# Patient Record
Sex: Female | Born: 1965 | Race: White | Hispanic: No | Marital: Married | Smoking: Current every day smoker
Health system: Southern US, Community
[De-identification: ages and names within clinical notes are randomized; demographics above are authoritative.]

## PROBLEM LIST (undated history)

## (undated) DIAGNOSIS — R51 Headache: Secondary | ICD-10-CM

## (undated) DIAGNOSIS — F32A Depression, unspecified: Secondary | ICD-10-CM

## (undated) DIAGNOSIS — N390 Urinary tract infection, site not specified: Secondary | ICD-10-CM

## (undated) DIAGNOSIS — F329 Major depressive disorder, single episode, unspecified: Secondary | ICD-10-CM

## (undated) DIAGNOSIS — G7000 Myasthenia gravis without (acute) exacerbation: Secondary | ICD-10-CM

## (undated) DIAGNOSIS — F419 Anxiety disorder, unspecified: Secondary | ICD-10-CM

## (undated) HISTORY — DX: Headache: R51

## (undated) HISTORY — DX: Anxiety disorder, unspecified: F41.9

## (undated) HISTORY — DX: Depression, unspecified: F32.A

## (undated) HISTORY — PX: ABDOMINAL HYSTERECTOMY: SHX81

## (undated) HISTORY — DX: Major depressive disorder, single episode, unspecified: F32.9

## (undated) HISTORY — DX: Urinary tract infection, site not specified: N39.0

---

## 2012-09-20 ENCOUNTER — Ambulatory Visit (HOSPITAL_COMMUNITY): Payer: BC Managed Care – PPO | Admitting: Psychiatry

## 2012-10-12 ENCOUNTER — Ambulatory Visit (INDEPENDENT_AMBULATORY_CARE_PROVIDER_SITE_OTHER): Payer: BC Managed Care – PPO | Admitting: Psychiatry

## 2012-10-12 ENCOUNTER — Encounter (HOSPITAL_COMMUNITY): Payer: Self-pay | Admitting: Psychiatry

## 2012-10-12 VITALS — Wt 124.0 lb

## 2012-10-12 DIAGNOSIS — F329 Major depressive disorder, single episode, unspecified: Secondary | ICD-10-CM

## 2012-10-12 DIAGNOSIS — F5105 Insomnia due to other mental disorder: Secondary | ICD-10-CM | POA: Insufficient documentation

## 2012-10-12 DIAGNOSIS — F418 Other specified anxiety disorders: Secondary | ICD-10-CM | POA: Insufficient documentation

## 2012-10-12 DIAGNOSIS — F341 Dysthymic disorder: Secondary | ICD-10-CM

## 2012-10-12 DIAGNOSIS — F411 Generalized anxiety disorder: Secondary | ICD-10-CM

## 2012-10-12 MED ORDER — MIRTAZAPINE 7.5 MG PO TABS: 7.5000 mg | ORAL_TABLET | Freq: Every day | ORAL | Status: DC

## 2012-10-12 MED ORDER — VENLAFAXINE HCL ER 150 MG PO CP24: 150.0000 mg | ORAL_CAPSULE | Freq: Every day | ORAL | Status: DC

## 2012-10-12 NOTE — Progress Notes (Signed)
Psychiatric Assessment Adult [phone]  Patient Identification:  Brooke Kerr Date of Evaluation:  10/12/2012 Start Time: 9:10 AM End Time: 10:34 AM  Chief Complaint: "I'm depressed on Effexor and I'm about to give up". Chief Complaint  Patient presents with  . Depression  . Establish Care   History of Chief Complaint:   For several years pt had noted a short fuse and moods that would go up and down.  She started on Effexor with good benefit for several years.  Now she feels so mixed up that she is unsure if it is helping or not.  She has had migraines for the last 5 years.  Topamax has helped them along with the Sumatriptan.   She supplements with Magnesium with good results. She has struggled with taking itand taking it she can't take any more.  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  She couldn't handle it any more and quit 1 year ago.  HPI Review of Systems  Constitutional: Negative.        Weight shifted from 112 to 120 up until 4 months ago.  HENT:       Nocturnal bruxism.  Eyes: Negative.   Cardiovascular: Negative.   Gastrointestinal: Positive for constipation.       Magnesium has relived the constipation  Endocrine: Negative.   Genitourinary: Negative.   Musculoskeletal: Negative.   Neurological: Positive for headaches. Negative for dizziness, tremors, seizures, syncope, facial asymmetry, speech difficulty, weakness, light-headedness and numbness.       Migraines relieved by Topamax and Sumatriptan   Psychiatric/Behavioral: Positive for sleep disturbance, dysphoric mood, decreased concentration and agitation. Negative for suicidal ideas, hallucinations, behavioral problems, confusion and self-injury. The patient is nervous/anxious. The patient is not hyperactive.        Terminal insomnia after 1 hour of sleep or flipping and flopping all night   Physical Exam  Depressive Symptoms: depressed  mood, insomnia, feelings of worthlessness/guilt, difficulty concentrating, hopelessness, anxiety, disturbed sleep,  (Hypo) Manic Symptoms:   None  Anxiety Symptoms: Excessive Worry:  Yes Panic Symptoms:  Yes Agoraphobia:  Yes Obsessive Compulsive: No Specific Phobias:  No Social Anxiety:  No  Psychotic Symptoms:  None  PTSD Symptoms: None  Traumatic Brain Injury: No   Past Psychiatric History: Diagnosis: Depression  Hospitalizations: none  Outpatient Care: OBGYN  Substance Abuse Care: none  Self-Mutilation: none  Suicidal Attempts: none  Violent Behaviors: none   Past Medical History:   Past Medical History  Diagnosis Date  . Anxiety   . Depression   . Headache   . UTI (lower urinary tract infection)    History of Loss of Consciousness:  No Seizure History:  No Cardiac History:  No Allergies:   Allergies  Allergen Reactions  . Ambien (Zolpidem) Other (See Comments)    "Loopy"   Current Medications:  Current Outpatient Prescriptions  Medication Sig Dispense Refill  . SUMAtriptan (IMITREX) 100 MG tablet Take 100 mg by mouth every 2 (two) hours as needed for migraine.      . topiramate (TOPAMAX) 100 MG tablet Take 100 mg by mouth 2 (two) times daily.      Marland Kitchen venlafaxine XR (EFFEXOR-XR) 150 MG 24 hr capsule Take 150 mg by mouth daily.       No current facility-administered medications for this visit.    Previous Psychotropic Medications:  Medication Dose   Xanax     Klonopin    Ambien  Effexor    Topamax          Substance Abuse History in the last 12 months: Substance Age of 1st Use Last Use Amount Specific Type  Nicotine  16  this AM      Alcohol  17  5 mo ago      Cannabis  none        Opiates  none        Cocaine  none        Methamphetamines  none        LSD  none        Ecstasy  none         Benzodiazepines  none        Caffeine  childhood  This AM      Inhalants  none        Others:       sugar  childhood  This AM                   Medical Consequences of Substance Abuse: none  Legal Consequences of Substance Abuse: none  Family Consequences of Substance Abuse: none  Social History: Current Place of Residence: [address] [place] [address] Place of Birth: [place], [place] Family Members: Husband Marital Status:  Married Children: 2  Sons: 1  Daughters: 1 Relationships: husband Education:  Corporate treasurer Problems/Performance: fine Religious Beliefs/Practices: Baptist History of Abuse: none Armed forces technical officer; Archivist History:  None. Legal History: none Hobbies/Interests: yard work, grand daughter  Family History:  No family history on file.  Mental Status Examination/Evaluation: Objective:  Appearance: Casual  Eye Contact::  Good  Speech:  Clear and Coherent  Volume:  Normal  Mood:  anxious  Affect:  Congruent  Thought Process:  Coherent  Orientation:  Full (Time, Place, and Person)  Thought Content:  WDL  Suicidal Thoughts:  No  Homicidal Thoughts:  No  Judgement:  Fair  Insight:  Fair  Psychomotor Activity:  Normal  Akathisia:  No  Handed:  Right  AIMS (if indicated):    Assets:  Communication Skills Desire for Improvement    Laboratory/X-Ray Psychological Evaluation(s)   none  none   Assessment:    AXIS I Dysthymic Disorder and Generalized Anxiety Disorder  AXIS II Deferred  AXIS III Past Medical History  Diagnosis Date  . Anxiety   . Depression   . Headache   . UTI (lower urinary tract infection)      AXIS IV other psychosocial or environmental problems  AXIS V 51-60 moderate symptoms   Treatment Plan/Recommendations:  Psychotherapy: supportive and CBT  Medications: Continue Effexor and add 5HTP along with low dose Remeron  Routine PRN Medications:  No  Consultations: none  Safety Concerns:  none  Other:     Plan/Discussion: I took her vitals.  I reviewed CC, tobacco/med/surg Hx, meds effects/ side effects, problem list, therapies and  responses as well as current situation/symptoms discussed options. See orders and pt instructions for more details.  MEDICATIONS this encounter: Meds ordered this encounter  Medications  . venlafaxine XR (EFFEXOR-XR) 150 MG 24 hr capsule    Sig: Take 150 mg by mouth daily.  Marland Kitchen topiramate (TOPAMAX) 100 MG tablet    Sig: Take 100 mg by mouth 2 (two) times daily.  . SUMAtriptan (IMITREX) 100 MG tablet    Sig: Take 100 mg by mouth every 2 (two) hours as needed for migraine.    Medical Decision Making Problem Points:  New problem, with additional work-up planned (4) and Review of psycho-social stressors (1) Data Points:  Review or order clinical lab tests (1) Review of medication regiment & side effects (2) Review of new medications or change in dosage (2)  I certify that outpatient services furnished can reasonably be expected to improve the patient's condition.   Orson Aloe, MD, [facility]

## 2012-10-12 NOTE — Patient Instructions (Addendum)
One fourth teaspoon of Cream of Tartar in a glass of water every day may help prevent urinary tract infections.   Could use "Move Free" or "Osteo bi Flex" for arthritic pain.   The important ingredients are Chondrotin Sulfate and Glucosamine.  Tumeric is also helpful for arthritis.   Krill oil and cod liver oil may be helpful for arthritis.   MegaChaga contains Oregeno and Chaga and seems to be very helpful  [employer] is a great source for all of these.  [phone]  Glori Luis is a mushroom that has the strongest antiinflammatory properties of any substance known to mankind.  Among other sources, it can be ordered from [facility].com  "Native Wisdom for [employer] by Camelia Phenes is a book that could be very helpful.  Some have gotten it on [employer].com for very low cost.  Strongly consider attending at least 6 Alanon Meetings to help you learn about how your helping others to the exclusion of helping yourself is actually hurting yourself and is actually an addiction to fixing others and that you need to work the 12 Step to Happiness through the [employer]. Al-Anon Family Groups could be helpful with how to deal with substance abusing family and friends. Or your own issues of being in victim role.  There are only 40 Alanon Family Group meetings a week here in [place].  Online are current listing of those meetings @ [place]alan[place].org/html/meetings.html  There are [employer].  Search on line and there you can learn the format and can access the schedule for yourself.  Their number is [phone]  Adult Children of Alcoholics has some amazing literature available on line.  The work book is Tree surgeon.  Their web site is awesome.  Set a timer for 8 or a certain number minutes and walk for that amount of time in the house or in the yard.  Mark the number of minutes on a calendar for that day.  Do that every day this week.  Then next week increase the time by 1  minutes and then mark the calendar with the number of minutes for that day.  Each week increase your exercise by one minute.  Keep a record of this so you can see the progress you are making.  Do this every day, just like eating and sleeping.  It is good for pain control, depression, and for your soul/spirit.  Bring the record in for your next visit so we can talk about your effort and how you feel with the new exercise program going and working for you.  Tilden Fossa is an excellent resource for hormone replacement.  She is a Publishing rights manager who has practiced for 15 years in the field of weight management, but has ventured into hormone replacement and is quite knowledgeable about that.  Her phone number is [phone].    CUT BACK/CUT OUT on sugar and carbohydrates, that means very limited fruits and starchy vegetables and very limited grains, breads  The goal is low GLYCEMIC INDEX.  CUT OUT all wheat, rye, or barley for the GLUTEN in them.  HIGH fat and LOW carbohydrate diet is the KEY.  Eat avocados, eggs, lean meat like grass fed beef and chicken  Nuts and seeds would be good foods as well.   Stevia is an excellent sweetener.  Safe for the brain.   Lowella Grip is also a good safe sweetener, not the baking blend form of Truvia  Almond butter is awesome.  Check out  all this on the Internet.  Dr Heber Lawton is on the Internet with some good info about this.   http://www.drperlmutter.com is where that is.  An excellent site for info on this diet is http://paleoleap.com  [employer] makes dark chocolate that is sweetened with Stevia that is safe.  5 HTP 50 mg would something to add to the Effexor to get more antidepressant action out of it.  Take care of yourself.  No one else is standing up to do the job and only you know what you need.   GET SERIOUS about taking care of yourself.  Do the next right thing and that often means doing something to care for yourself along the lines  of are you hungry, are you angry, are you lonely, are you tired, are you scared?  HALTS is what that stands for.  Call if problems or concerns.

## 2012-11-09 ENCOUNTER — Ambulatory Visit (HOSPITAL_COMMUNITY): Payer: BC Managed Care – PPO | Admitting: Psychiatry

## 2012-11-09 ENCOUNTER — Encounter (HOSPITAL_COMMUNITY): Payer: Self-pay | Admitting: Psychiatry

## 2012-11-09 ENCOUNTER — Ambulatory Visit (INDEPENDENT_AMBULATORY_CARE_PROVIDER_SITE_OTHER): Payer: BC Managed Care – PPO | Admitting: Psychiatry

## 2012-11-09 VITALS — BP 114/85 | HR 81 | Ht 66.75 in | Wt 126.4 lb

## 2012-11-09 DIAGNOSIS — F329 Major depressive disorder, single episode, unspecified: Secondary | ICD-10-CM

## 2012-11-09 DIAGNOSIS — F418 Other specified anxiety disorders: Secondary | ICD-10-CM

## 2012-11-09 DIAGNOSIS — F411 Generalized anxiety disorder: Secondary | ICD-10-CM

## 2012-11-09 DIAGNOSIS — F341 Dysthymic disorder: Secondary | ICD-10-CM

## 2012-11-09 MED ORDER — TRAZODONE HCL 50 MG PO TABS: 25.0000 mg | ORAL_TABLET | Freq: Every day | ORAL | Status: DC

## 2012-11-09 MED ORDER — VENLAFAXINE HCL ER 75 MG PO CP24: 225.0000 mg | ORAL_CAPSULE | Freq: Every day | ORAL | Status: DC

## 2012-11-09 NOTE — Patient Instructions (Signed)
Keep it up to take care of yourself.  "I am Wishes Fulfilled Meditation" by Marylene Buerger and Lyndal Pulley may be helpful MUSIC for getting to sleep or for meditating You can order it from on line.  You might find the Chill channel on [employer] and explore the artists that you like better.   We are striving for CREATING  Take care of yourself.  No one else is standing up to do the job and only you know what you need.   GET SERIOUS about taking care of yourself.  Do the next right thing and that often means doing something to care for yourself along the lines of are you hungry, are you angry, are you lonely, are you tired, are you scared?  HALTS is what that stands for.  Call if problems or concerns.

## 2012-11-09 NOTE — Progress Notes (Signed)
[facility] [address] Progress Note Rheta Hemmelgarn. Gosling MRN: [id number] DOB: [birth year] Age: 47 y.o.  Date: 11/09/2012 Start Time: 9:55 AM End Time: 10:40 AM  Chief Complaint: Chief Complaint  Patient presents with  . Depression  . Follow-up  . Medication Refill   Subjective: "I have been walking some and I just can't let got of helping others". Depression 5/10 and Anxiety 6/10, where 0 is none and 10 is the worst. Pain is 0/10.  The patient returns for follow-up appointment.  Pt reports that she is compliant with the psychotropic medications with poor benefit and no noticeable side effects.  Pt noted no benefit to the Remeron for sleep.  She is struggled with taking care of herself and has kept it up for a whole month.  She started reading in the Native Wisdom book and related to the passage about Roots which was the devotion from a few days ago that she is only here in the mega care taker role from her family of origin.  She is trying to warn her daughter about this, but I cautioned her that her daughter will have to experience this for herself.  Her husband will have to have neck surgery in 2 weeks.  She was given the notion of "Can this person physically do this themselves, if so then she needs to let that activity go and let them do that".  With no benefit from the Remeon, will stop.  With her never having been on 225 mg of Effexor, will push to that and add Trazodone for sedation.  History of Chief Complaint:   For several years pt had noted a short fuse and moods that would go up and down.  She started on Effexor with good benefit for several years.  Now she feels so mixed up that she is unsure if it is helping or not.  She has had migraines for the last 5 years.  Topamax has helped them along with the Sumatriptan.   She supplements with Magnesium with good results. She has struggled with taking itand taking it she can't take any more.  Her job sifted and she worked for a company  that had several people quit and not hire replacements.  She couldn't handle it any more and quit 1 year ago.  HPI Review of Systems  Constitutional: Negative.        Weight shifted from 112 to 120 up until 4 months ago.  HENT:       Nocturnal bruxism.  Eyes: Negative.   Cardiovascular: Negative.   Gastrointestinal: Positive for constipation.       Magnesium has relived the constipation  Endocrine: Negative.   Genitourinary: Negative.   Musculoskeletal: Negative.   Neurological: Positive for headaches. Negative for dizziness, tremors, seizures, syncope, facial asymmetry, speech difficulty, weakness, light-headedness and numbness.       Migraines relieved by Topamax and Sumatriptan   Psychiatric/Behavioral: Positive for sleep disturbance, dysphoric mood, decreased concentration and agitation. Negative for suicidal ideas, hallucinations, behavioral problems, confusion and self-injury. The patient is nervous/anxious. The patient is not hyperactive.        Terminal insomnia after 1 hour of sleep or flipping and flopping all night   Physical Exam  Depressive Symptoms: depressed mood, insomnia, feelings of worthlessness/guilt, difficulty concentrating, hopelessness, anxiety, disturbed sleep,  (Hypo) Manic Symptoms:   None  Anxiety Symptoms: Excessive Worry:  Yes Panic Symptoms:  Yes Agoraphobia:  Yes Obsessive Compulsive: No Specific Phobias:  No Social Anxiety:  No  Psychotic Symptoms:  None  PTSD Symptoms: None  Traumatic Brain Injury: No   Past Psychiatric History: Diagnosis: Depression  Hospitalizations: none  Outpatient Care: OBGYN  Substance Abuse Care: none  Self-Mutilation: none  Suicidal Attempts: none  Violent Behaviors: none   Past Medical History:   Past Medical History  Diagnosis Date  . Anxiety   . Depression   . Headache   . UTI (lower urinary tract infection)    History of Loss of Consciousness:  No Seizure History:  No Cardiac History:   No Allergies:   Allergies  Allergen Reactions  . Ambien (Zolpidem) Other (See Comments)    "Loopy"   Current Medications:  Current Outpatient Prescriptions  Medication Sig Dispense Refill  . venlafaxine XR (EFFEXOR-XR) 75 MG 24 hr capsule Take 3 capsules (225 mg total) by mouth daily.  90 capsule  1  . SUMAtriptan (IMITREX) 100 MG tablet Take 100 mg by mouth every 2 (two) hours as needed for migraine.      . topiramate (TOPAMAX) 100 MG tablet Take 100 mg by mouth 2 (two) times daily.      . traZODone (DESYREL) 50 MG tablet Take 0.5-2 tablets (25-100 mg total) by mouth at bedtime.  60 tablet  1   No current facility-administered medications for this visit.    Previous Psychotropic Medications:  Medication Dose   Xanax     Klonopin    Ambien    Effexor    Topamax    Substance Abuse History in the last 12 months: Substance Age of 1st Use Last Use Amount Specific Type  Nicotine  16  this AM      Alcohol  17  5 mo ago      Cannabis  none        Opiates  none        Cocaine  none        Methamphetamines  none        LSD  none        Ecstasy  none         Benzodiazepines  none        Caffeine  childhood  This AM      Inhalants  none        Others:       sugar  childhood  This AM    Medical Consequences of Substance Abuse: none Legal Consequences of Substance Abuse: none Family Consequences of Substance Abuse: none  Social History: Current Place of Residence: [address] [place] [address] Place of Birth: [place], [place] Family Members: Husband Marital Status:  Married Children: 2  Sons: 1  Daughters: 1 Relationships: husband Education:  Corporate treasurer Problems/Performance: fine Religious Beliefs/Practices: Baptist History of Abuse: none Armed forces technical officer; Archivist History:  None. Legal History: none Hobbies/Interests: yard work, grand daughter  Family History:   Family History  Problem Relation Age of Onset  . Dementia Maternal  Grandfather   . Depression Maternal Grandmother   . ADD / ADHD Neg Hx   . Alcohol abuse Neg Hx   . Drug abuse Neg Hx   . Bipolar disorder Neg Hx   . Anxiety disorder Neg Hx   . OCD Neg Hx   . Paranoid behavior Neg Hx   . Schizophrenia Neg Hx   . Sexual abuse Neg Hx   . Physical abuse Neg Hx   . Seizures Son     Mental Status Examination/Evaluation: Objective:  Appearance: Casual  Eye Contact::  Good  Speech:  Clear and Coherent  Volume:  Normal  Mood:  anxious  Affect:  Congruent  Thought Process:  Coherent  Orientation:  Full (Time, Place, and Person)  Thought Content:  WDL  Suicidal Thoughts:  No  Homicidal Thoughts:  No  Judgement:  Fair  Insight:  Fair  Psychomotor Activity:  Normal  Akathisia:  No  Handed:  Right  AIMS (if indicated):    Assets:  Communication Skills Desire for Improvement   Assessment:    AXIS I Dysthymic Disorder and Generalized Anxiety Disorder  AXIS II Deferred  AXIS III Past Medical History  Diagnosis Date  . Anxiety   . Depression   . Headache   . UTI (lower urinary tract infection)      AXIS IV other psychosocial or environmental problems  AXIS V 51-60 moderate symptoms   Treatment Plan/Recommendations:  Psychotherapy: supportive and CBT  Medications: Continue Effexor and add 5HTP along with low dose Remeron  Routine PRN Medications:  No  Consultations: none  Safety Concerns:  none  Other:     Plan/Discussion: I took her vitals.  I reviewed CC, tobacco/med/surg Hx, meds effects/ side effects, problem list, therapies and responses as well as current situation/symptoms discussed options. Stop Remeron, ineffective, switch to Trazodone and push Effexor for better antidepressant action. See orders and pt instructions for more details.  MEDICATIONS this encounter: Meds ordered this encounter  Medications  . venlafaxine XR (EFFEXOR-XR) 75 MG 24 hr capsule    Sig: Take 3 capsules (225 mg total) by mouth daily.    Dispense:  90  capsule    Refill:  1  . traZODone (DESYREL) 50 MG tablet    Sig: Take 0.5-2 tablets (25-100 mg total) by mouth at bedtime.    Dispense:  60 tablet    Refill:  1   Medical Decision Making Problem Points:  Established problem, stable/improving (1), Established problem, worsening (2), Review of last therapy session (1) and Review of psycho-social stressors (1) Data Points:  Review or order clinical lab tests (1) Review of new medications or change in dosage (2)  I certify that outpatient services furnished can reasonably be expected to improve the patient's condition.   Orson Aloe, MD, [facility]

## 2012-11-21 ENCOUNTER — Telehealth (HOSPITAL_COMMUNITY): Payer: Self-pay | Admitting: Psychiatry

## 2012-11-21 DIAGNOSIS — F329 Major depressive disorder, single episode, unspecified: Secondary | ICD-10-CM

## 2012-11-21 DIAGNOSIS — F418 Other specified anxiety disorders: Secondary | ICD-10-CM

## 2012-11-22 MED ORDER — TRAZODONE HCL 100 MG PO TABS: 200.0000 mg | ORAL_TABLET | Freq: Every day | ORAL | Status: DC

## 2012-11-22 NOTE — Telephone Encounter (Signed)
Pt taking 2 of the 50's and needing more to get to sleep.She thinks that 4 would be better, She agrees to shift to the 100 mg tabs and will go with 2 of those.  Sent via eScript

## 2012-12-07 ENCOUNTER — Ambulatory Visit (HOSPITAL_COMMUNITY): Payer: BC Managed Care – PPO | Admitting: Psychiatry

## 2012-12-13 ENCOUNTER — Ambulatory Visit (INDEPENDENT_AMBULATORY_CARE_PROVIDER_SITE_OTHER): Payer: BC Managed Care – PPO | Admitting: Psychiatry

## 2012-12-13 ENCOUNTER — Encounter (HOSPITAL_COMMUNITY): Payer: Self-pay | Admitting: Psychiatry

## 2012-12-13 VITALS — BP 104/72 | HR 85 | Ht 68.5 in | Wt 125.8 lb

## 2012-12-13 DIAGNOSIS — F418 Other specified anxiety disorders: Secondary | ICD-10-CM

## 2012-12-13 DIAGNOSIS — F411 Generalized anxiety disorder: Secondary | ICD-10-CM

## 2012-12-13 DIAGNOSIS — F4010 Social phobia, unspecified: Secondary | ICD-10-CM

## 2012-12-13 DIAGNOSIS — F329 Major depressive disorder, single episode, unspecified: Secondary | ICD-10-CM

## 2012-12-13 DIAGNOSIS — F341 Dysthymic disorder: Secondary | ICD-10-CM

## 2012-12-13 MED ORDER — PROPRANOLOL HCL 10 MG PO TABS: 10.0000 mg | ORAL_TABLET | Freq: Three times a day (TID) | ORAL | Status: DC

## 2012-12-13 MED ORDER — VENLAFAXINE HCL ER 75 MG PO CP24: 225.0000 mg | ORAL_CAPSULE | Freq: Every day | ORAL | Status: DC

## 2012-12-13 MED ORDER — TRAZODONE HCL 100 MG PO TABS: 200.0000 mg | ORAL_TABLET | Freq: Every day | ORAL | Status: DC

## 2012-12-13 MED ORDER — PROPRANOLOL HCL ER 60 MG PO CP24: 60.0000 mg | ORAL_CAPSULE | Freq: Every day | ORAL | Status: DC

## 2012-12-13 NOTE — Progress Notes (Signed)
[facility] [address] Progress Note Brooke Kerr. Cipollone MRN: [id number] DOB: [birth year] Age: 47 y.o.  Date: 12/13/2012 Start Time: 2:00 PM End Time: 2:30 PM  Chief Complaint: Chief Complaint  Patient presents with  . Depression  . Follow-up  . Medication Refill   Subjective: "I have been working around my pool and singing the Frozen theme song "Let it go" to herself and her daughter has been reminding her to do so at opportune times". Depression 3 to 4/10 and Anxiety 6/10, where 0 is none and 10 is the worst. Pain is 0/10.  The patient returns for follow-up appointment.  Pt reports that she is compliant with the psychotropic medications with good benefit and no noticeable side effects.  Pt noted better sleep with the Trazodone.  She is still noting social anxiety,  Described the fight or flight response and she describes feeling that exact response and she feels it rise in her body.  She is game to try Inderal at reg dosing and the LA dosing.   Husband is doing well with his surgery.  History of Chief Complaint:   For several years pt had noted a short fuse and moods that would go up and down.  She started on Effexor with good benefit for several years.  Now she feels so mixed up that she is unsure if it is helping or not.  She has had migraines for the last 5 years.  Topamax has helped them along with the Sumatriptan.   She supplements with Magnesium with good results. She has struggled with taking itand taking it she can't take any more.  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  She couldn't handle it any more and quit 1 year ago.  HPI Review of Systems  Constitutional: Negative.        Weight shifted from 112 to 120 up until 4 months ago.  HENT:       Nocturnal bruxism.  Eyes: Negative.   Cardiovascular: Negative.   Gastrointestinal: Positive for constipation.       Magnesium has relived the constipation  Endocrine:  Negative.   Genitourinary: Negative.   Musculoskeletal: Negative.   Neurological: Positive for headaches. Negative for dizziness, tremors, seizures, syncope, facial asymmetry, speech difficulty, weakness, light-headedness and numbness.       Migraines relieved by Topamax and Sumatriptan   Psychiatric/Behavioral: Positive for sleep disturbance, dysphoric mood, decreased concentration and agitation. Negative for suicidal ideas, hallucinations, behavioral problems, confusion and self-injury. The patient is nervous/anxious. The patient is not hyperactive.        Terminal insomnia after 1 hour of sleep or flipping and flopping all night   Physical Exam Vitals: BP 104/72  Pulse 85  Ht 5' 8.5" (1.74 m)  Wt 125 lb 12.8 oz (57.063 kg)  BMI 18.85 kg/m2  Depressive Symptoms: depressed mood, insomnia, feelings of worthlessness/guilt, difficulty concentrating, hopelessness, anxiety, disturbed sleep,  (Hypo) Manic Symptoms:   None  Anxiety Symptoms: Excessive Worry:  Yes Panic Symptoms:  Yes Agoraphobia:  Yes Obsessive Compulsive: No Specific Phobias:  No Social Anxiety:  No  Psychotic Symptoms:  None  PTSD Symptoms: None  Traumatic Brain Injury: No  History of Loss of Consciousness:  No Seizure History:  No Cardiac History:  No  Past Psychiatric History: Diagnosis: Depression  Hospitalizations: none  Outpatient Care: OBGYN  Substance Abuse Care: none  Self-Mutilation: none  Suicidal Attempts: none  Violent Behaviors: none  Allergies: Allergies  Allergen Reactions  . Ambien (Zolpidem) Other (See Comments)    "Loopy"   Medical History: Past Medical History  Diagnosis Date  . Anxiety   . Depression   . Headache(784.0)   . UTI (lower urinary tract infection)    Surgical History: Past Surgical History  Procedure Laterality Date  . Abdominal hysterectomy     Family History: family history includes Dementia in her maternal grandfather; Depression in her  maternal grandmother; and Seizures in her son.  There is no history of ADD / ADHD, and Alcohol abuse, and Drug abuse, and Bipolar disorder, and Anxiety disorder, and OCD, and Paranoid behavior, and Schizophrenia, and Sexual abuse, and Physical abuse, . Reviewed and nothing is new today.  Current Medications:  Current Outpatient Prescriptions  Medication Sig Dispense Refill  . traZODone (DESYREL) 100 MG tablet Take 2 tablets (200 mg total) by mouth at bedtime.  60 tablet  2  . venlafaxine XR (EFFEXOR-XR) 75 MG 24 hr capsule Take 3 capsules (225 mg total) by mouth daily.  90 capsule  2  . propranolol (INDERAL) 10 MG tablet Take 1 tablet (10 mg total) by mouth 3 (three) times daily.  90 tablet  2  . propranolol ER (INDERAL LA) 60 MG 24 hr capsule Take 1 capsule (60 mg total) by mouth daily.  30 capsule  2  . SUMAtriptan (IMITREX) 100 MG tablet Take 100 mg by mouth every 2 (two) hours as needed for migraine.      . topiramate (TOPAMAX) 100 MG tablet Take 100 mg by mouth 2 (two) times daily.       No current facility-administered medications for this visit.    Previous Psychotropic Medications: Medication Dose   Xanax     Klonopin    Ambien    Effexor    Topamax    Substance Abuse History in the last 12 months: Substance Age of 1st Use Last Use Amount Specific Type  Nicotine  16  this AM      Alcohol  17  5 mo ago      Cannabis  none        Opiates  none        Cocaine  none        Methamphetamines  none        LSD  none        Ecstasy  none         Benzodiazepines  none        Caffeine  childhood  This AM      Inhalants  none        Others:       sugar  childhood  This AM    Medical Consequences of Substance Abuse: none Legal Consequences of Substance Abuse: none Family Consequences of Substance Abuse: none  Social History: Current Place of Residence: [address] [place] [address] Place of Birth: [place], [place] Family Members: Husband Marital Status:  Married Children:  2  Sons: 1  Daughters: 1 Relationships: husband Education:  Corporate treasurer Problems/Performance: fine Religious Beliefs/Practices: Baptist History of Abuse: none Armed forces technical officer; Archivist History:  None. Legal History: none Hobbies/Interests: yard work, grand daughter  Mental Status Examination/Evaluation: Objective:  Appearance: Casual  Eye Contact::  Good  Speech:  Clear and Coherent  Volume:  Normal  Mood:  anxious  Affect:  Congruent  Thought Process:  Coherent  Orientation:  Full (Time, Place, and Person)  Thought Content:  WDL  Suicidal Thoughts:  No  Homicidal Thoughts:  No  Judgement:  Fair  Insight:  Fair  Psychomotor Activity:  Normal  Akathisia:  No  Handed:  Right  AIMS (if indicated):    Assets:  Communication Skills Desire for Improvement   Assessment:   AXIS I Dysthymic Disorder and Generalized Anxiety Disorder  AXIS II Deferred  AXIS III Past Medical History  Diagnosis Date  . Anxiety   . Depression   . Headache(784.0)   . UTI (lower urinary tract infection)      AXIS IV other psychosocial or environmental problems  AXIS V 51-60 moderate symptoms   Treatment Plan/Recommendations: Psychotherapy: supportive and CBT  Medications: Continue Effexor and add 5HTP along with low dose Remeron  Routine PRN Medications:  No  Consultations: none  Safety Concerns:  none  Other:     Plan/Discussion: I took her vitals.  I reviewed CC, tobacco/med/surg Hx, meds effects/ side effects, problem list, therapies and responses as well as current situation/symptoms discussed options. Continue current effective medications and start Inderal for social anxiety. See orders and pt instructions for more details.  MEDICATIONS this encounter: Meds ordered this encounter  Medications  . venlafaxine XR (EFFEXOR-XR) 75 MG 24 hr capsule    Sig: Take 3 capsules (225 mg total) by mouth daily.    Dispense:  90 capsule    Refill:  2  . traZODone  (DESYREL) 100 MG tablet    Sig: Take 2 tablets (200 mg total) by mouth at bedtime.    Dispense:  60 tablet    Refill:  2  . propranolol (INDERAL) 10 MG tablet    Sig: Take 1 tablet (10 mg total) by mouth 3 (three) times daily.    Dispense:  90 tablet    Refill:  2  . propranolol ER (INDERAL LA) 60 MG 24 hr capsule    Sig: Take 1 capsule (60 mg total) by mouth daily.    Dispense:  30 capsule    Refill:  2    Hold until pt calls for this, may take with the regular sparingly.   Medical Decision Making Problem Points:  Established problem, stable/improving (1), New problem, with no additional work-up planned (3), Review of last therapy session (1) and Review of psycho-social stressors (1) Data Points:  Review or order clinical lab tests (1) Review of medication regiment & side effects (2) Review of new medications or change in dosage (2)  I certify that outpatient services furnished can reasonably be expected to improve the patient's condition.   Orson Aloe, MD, [facility]

## 2012-12-13 NOTE — Patient Instructions (Signed)
Try the regular Inderal first, if dizziness upon standing may take 1/2 tab 2 or 3 times a day.  If tolerated well could try the LA form on hold at the pharmacy.   Call if problems or concerns.

## 2013-01-08 ENCOUNTER — Other Ambulatory Visit (HOSPITAL_COMMUNITY): Payer: Self-pay | Admitting: Psychiatry

## 2013-02-13 ENCOUNTER — Ambulatory Visit (HOSPITAL_COMMUNITY): Payer: BC Managed Care – PPO | Admitting: Psychiatry

## 2013-03-13 ENCOUNTER — Other Ambulatory Visit (HOSPITAL_COMMUNITY): Payer: Self-pay | Admitting: Psychiatry

## 2013-05-03 ENCOUNTER — Ambulatory Visit (HOSPITAL_COMMUNITY): Payer: BC Managed Care – PPO | Admitting: Psychiatry

## 2013-06-16 ENCOUNTER — Other Ambulatory Visit (HOSPITAL_COMMUNITY): Payer: Self-pay | Admitting: Psychiatry

## 2013-06-19 NOTE — Telephone Encounter (Signed)
RX refill refused Pt last appt with Dr. Dan Humphreys in [place] office 12/13/12. No further appts

## 2015-08-30 ENCOUNTER — Ambulatory Visit (INDEPENDENT_AMBULATORY_CARE_PROVIDER_SITE_OTHER): Payer: BLUE CROSS/BLUE SHIELD | Admitting: "Endocrinology

## 2015-08-30 ENCOUNTER — Encounter: Payer: Self-pay | Admitting: "Endocrinology

## 2015-08-30 VITALS — BP 127/81 | HR 67 | Ht 68.5 in | Wt 111.0 lb

## 2015-08-30 DIAGNOSIS — E0590 Thyrotoxicosis, unspecified without thyrotoxic crisis or storm: Secondary | ICD-10-CM | POA: Diagnosis not present

## 2015-08-30 DIAGNOSIS — E890 Postprocedural hypothyroidism: Secondary | ICD-10-CM | POA: Insufficient documentation

## 2015-08-30 NOTE — Patient Instructions (Signed)
Please stop methimazole today, do your scan in 7 days and return in 10 days. Continue propranolol 40 mg by mouth two times a day.

## 2015-08-30 NOTE — Progress Notes (Signed)
Subjective:    Patient ID: Brooke Kerr, female    DOB: Apr 01, 1966, PCP Kennieth Rad, MD.   Past Medical History  Diagnosis Date  . Anxiety   . Depression   . Headache(784.0)   . UTI (lower urinary tract infection)    Past Surgical History  Procedure Laterality Date  . Abdominal hysterectomy     Social History   Social History  . Marital Status: Unknown    Spouse Name: N/A  . Number of Children: N/A  . Years of Education: N/A   Social History Main Topics  . Smoking status: Current Every Day Smoker  . Smokeless tobacco: Never Used     Comment: 3-6 cigs a day as of 12/13/2012  . Alcohol Use: No  . Drug Use: No  . Sexual Activity:    Partners: Male   Other Topics Concern  . None   Social History Narrative   Outpatient Encounter Prescriptions as of 08/30/2015  Medication Sig  . butalbital-acetaminophen-caffeine (FIORICET WITH CODEINE) 50-325-40-30 MG capsule Take 1 capsule by mouth every 4 (four) hours as needed for headache.  . Linaclotide (LINZESS) 145 MCG CAPS capsule Take 145 mcg by mouth daily.  . propranolol (INDERAL) 40 MG tablet Take 40 mg by mouth 2 (two) times daily.  Marland Kitchen topiramate (TOPAMAX) 200 MG tablet Take 200 mg by mouth 2 (two) times daily.  Marland Kitchen venlafaxine XR (EFFEXOR-XR) 75 MG 24 hr capsule Take 3 capsules (225 mg total) by mouth daily.  . [DISCONTINUED] methimazole (TAPAZOLE) 10 MG tablet Take 10 mg by mouth daily.  . [DISCONTINUED] propranolol (INDERAL) 10 MG tablet Take 1 tablet (10 mg total) by mouth 3 (three) times daily.  . [DISCONTINUED] propranolol ER (INDERAL LA) 60 MG 24 hr capsule Take 1 capsule (60 mg total) by mouth daily.  . [DISCONTINUED] SUMAtriptan (IMITREX) 100 MG tablet Take 100 mg by mouth every 2 (two) hours as needed for migraine.  . [DISCONTINUED] topiramate (TOPAMAX) 100 MG tablet Take 100 mg by mouth 2 (two) times daily.  . [DISCONTINUED] traZODone (DESYREL) 100 MG tablet Take 2 tablets (200 mg total) by mouth at bedtime.    No facility-administered encounter medications on file as of 08/30/2015.   ALLERGIES: Allergies  Allergen Reactions  . Ambien [Zolpidem] Other (See Comments)    "Loopy"   VACCINATION STATUS:  There is no immunization history on file for this patient.  HPI  The patient presents today with a medical history as above, and is being seen in consultation for hyperthyroidism requested by Dr. Shearon Stalls.  The patient has been dealing with symptoms of  Weight loss of 12 lbs, palpitations, and tremors, and sleep disturbance associated with anxiety for 1 yr. These symptoms are progressively worsening and troubling to patient.   The patient denies  family history of thyroid dysfunction  , and  the patient denies personal history of goiter. She is on Methimazole and propranolol. Patient  is willing to proceed with appropriate work up and therapy for thyrotoxicosis.   Review of Systems Constitutional: + weight loss, + fatigue, +objective hyperthermia, +sleep disturbance. Eyes: no blurry vision, no xerophthalmia ENT: no sore throat, no nodules palpated in throat, no dysphagia/odynophagia, no hoarseness Cardiovascular: no CP/SOB/palpitations/leg swelling Respiratory: no cough/SOB Gastrointestinal: no N/V/D/C Musculoskeletal: no muscle/joint aches Skin: no rashes Neurological: + tremors/numbness, +dizziness Psychiatric: + depression/anxiety  Objective:    BP 127/81 mmHg  Pulse 67  Ht 5' 8.5" (1.74 m)  Wt 111 lb (50.349 kg)  BMI 16.63  kg/m2  SpO2 98%  Wt Readings from Last 3 Encounters:  08/30/15 111 lb (50.349 kg)  12/13/12 125 lb 12.8 oz (57.063 kg)  11/09/12 126 lb 6.4 oz (57.335 kg)    Physical Exam  Constitutional: anxious affect, thin build, lost 12 lbs. in NAD Eyes: PERRLA, EOMI, no exophthalmos ENT: moist mucous membranes, no thyromegaly, no cervical lymphadenopathy Cardiovascular: RRR, No MRG Respiratory: CTA B Gastrointestinal: abdomen soft, NT, ND, BS+ Musculoskeletal:  no deformities, strength intact in all 4 Skin: moist, warm, no rashes Neurological:  +tremor with outstretched hands, DTR normal in all 4  Labs August 07, 2015 free t4 2.4, tsh 0.01    Assessment & Plan:   1. Hyperthyroidism   Patient's history and most recent labs are reviewed. Findings are consistent with thyrotoxicosis likely from hyperthyroidism. The potential risks of untreated thyrotoxicosis and the need for definitive therapy have been discussed in detail with the patient, and the patient agrees to proceed with plan.   I like to obtain confirmatory thyroid uptake and scan will be scheduled to be done  After a week off of methimazole.   Therapy may involve RAI ablation of the thyroid, with subsequent need for lifelong thyroid hormone replacement. Pt is made aware of this fact and willing to proceed. The patient will return in 10 days weeks for treatment decision. I advised her to continue  low dose Propranolol for symptomatic relief.  - 40 minutes of time was spent on the care of this patient , 50% of which was applied for counseling on complications of thyrotoxicosis, options ,  and the need for definitive therapy to prevent these complications.  - I advised patient to maintain close follow up with Kennieth Rad, MD for primary care needs.  Follow up plan: Return in about 10 days (around 09/09/2015) for overactive thyroid, thyroid uptake and scan.  Glade Lloyd, MD Phone: [phone]  Fax: [phone]   08/30/2015, 8:58 PM

## 2015-09-09 ENCOUNTER — Encounter (HOSPITAL_COMMUNITY): Payer: Self-pay

## 2015-09-09 ENCOUNTER — Encounter (HOSPITAL_COMMUNITY)
Admission: RE | Admit: 2015-09-09 | Discharge: 2015-09-09 | Disposition: A | Discharge status: Home / Self Care | Payer: BLUE CROSS/BLUE SHIELD | Source: Ambulatory Visit | Attending: "Endocrinology | Admitting: "Endocrinology

## 2015-09-09 DIAGNOSIS — E0590 Thyrotoxicosis, unspecified without thyrotoxic crisis or storm: Secondary | ICD-10-CM | POA: Insufficient documentation

## 2015-09-09 MED ORDER — SODIUM IODIDE I 131 CAPSULE
17.0000 | Freq: Once | INTRAVENOUS | Status: AC | PRN
  Administered 2015-09-09: 17 via ORAL

## 2015-09-10 ENCOUNTER — Encounter (HOSPITAL_COMMUNITY)
Admission: RE | Admit: 2015-09-10 | Discharge: 2015-09-10 | Disposition: A | Discharge status: Home / Self Care | Payer: BLUE CROSS/BLUE SHIELD | Source: Ambulatory Visit | Attending: "Endocrinology | Admitting: "Endocrinology

## 2015-09-10 DIAGNOSIS — E0590 Thyrotoxicosis, unspecified without thyrotoxic crisis or storm: Secondary | ICD-10-CM | POA: Diagnosis not present

## 2015-09-10 MED ORDER — SODIUM PERTECHNETATE TC 99M INJECTION
10.0000 | Freq: Once | INTRAVENOUS | Status: AC | PRN
  Administered 2015-09-10: 10 via INTRAVENOUS

## 2015-09-12 ENCOUNTER — Encounter: Payer: Self-pay | Admitting: "Endocrinology

## 2015-09-12 ENCOUNTER — Ambulatory Visit (INDEPENDENT_AMBULATORY_CARE_PROVIDER_SITE_OTHER): Payer: BLUE CROSS/BLUE SHIELD | Admitting: "Endocrinology

## 2015-09-12 VITALS — BP 106/74 | HR 76 | Resp 18 | Ht 68.5 in | Wt 110.0 lb

## 2015-09-12 DIAGNOSIS — E0590 Thyrotoxicosis, unspecified without thyrotoxic crisis or storm: Secondary | ICD-10-CM

## 2015-09-12 NOTE — Progress Notes (Signed)
Subjective:    Patient ID: Brooke Kerr. Lippy, female    DOB: 02-28-1966, PCP Kennieth Rad, MD.   Past Medical History  Diagnosis Date  . Anxiety   . Depression   . Headache(784.0)   . UTI (lower urinary tract infection)    Past Surgical History  Procedure Laterality Date  . Abdominal hysterectomy     Social History   Social History  . Marital Status: Unknown    Spouse Name: N/A  . Number of Children: N/A  . Years of Education: N/A   Social History Main Topics  . Smoking status: Current Every Day Smoker  . Smokeless tobacco: Never Used     Comment: 3-6 cigs a day as of 12/13/2012  . Alcohol Use: No  . Drug Use: No  . Sexual Activity:    Partners: Male   Other Topics Concern  . None   Social History Narrative   Outpatient Encounter Prescriptions as of 09/12/2015  Medication Sig  . butalbital-acetaminophen-caffeine (FIORICET WITH CODEINE) 50-325-40-30 MG capsule Take 1 capsule by mouth every 4 (four) hours as needed for headache.  . Linaclotide (LINZESS) 145 MCG CAPS capsule Take 145 mcg by mouth daily.  . propranolol (INDERAL) 40 MG tablet Take 40 mg by mouth 2 (two) times daily.  Marland Kitchen topiramate (TOPAMAX) 200 MG tablet Take 200 mg by mouth 2 (two) times daily.  Marland Kitchen venlafaxine XR (EFFEXOR-XR) 75 MG 24 hr capsule Take 3 capsules (225 mg total) by mouth daily.   No facility-administered encounter medications on file as of 09/12/2015.   ALLERGIES: Allergies  Allergen Reactions  . Ambien [Zolpidem] Other (See Comments)    "Loopy"   VACCINATION STATUS:  There is no immunization history on file for this patient.  HPI  The patient presents today with a medical history as above, and is being seen in consultation for hyperthyroidism requested by Dr. Shearon Stalls.  The patient has been dealing with symptoms of  Weight loss of 12 lbs, palpitations, and tremors, and sleep disturbance associated with anxiety for 1 yr. These symptoms are progressively worsening and troubling to  patient.   The patient denies  family history of thyroid dysfunction  , and  the patient denies personal history of goiter. She is on Methimazole and propranolol. Patient  is willing to proceed with appropriate work up and therapy for thyrotoxicosis.   Review of Systems Constitutional: + weight loss, + fatigue, +objective hyperthermia, +sleep disturbance. Eyes: no blurry vision, no xerophthalmia ENT: no sore throat, no nodules palpated in throat, no dysphagia/odynophagia, no hoarseness Cardiovascular: no CP/SOB/palpitations/leg swelling Respiratory: no cough/SOB Gastrointestinal: no N/V/D/C Musculoskeletal: no muscle/joint aches Skin: no rashes Neurological: + tremors/numbness, +dizziness Psychiatric: + depression/anxiety  Objective:    BP 106/74 mmHg  Pulse 76  Resp 18  Ht 5' 8.5" (1.74 m)  Wt 110 lb (49.896 kg)  BMI 16.48 kg/m2  SpO2 96%  Wt Readings from Last 3 Encounters:  09/12/15 110 lb (49.896 kg)  08/30/15 111 lb (50.349 kg)  12/13/12 125 lb 12.8 oz (57.063 kg)    Physical Exam  Constitutional: anxious affect, thin build, lost 12 lbs. in NAD Eyes: PERRLA, EOMI, no exophthalmos ENT: moist mucous membranes, no thyromegaly, no cervical lymphadenopathy Cardiovascular: RRR, No MRG Respiratory: CTA B Gastrointestinal: abdomen soft, NT, ND, BS+ Musculoskeletal: no deformities, strength intact in all 4 Skin: moist, warm, no rashes Neurological:  +tremor with outstretched hands, DTR normal in all 4  Labs August 07, 2015 free t4 2.4, tsh 0.01  FINDINGS Of her thyroid uptake and scan showed: There is uniform uptake within the thyroid gland. The uptake is  high. No focal nodularity. The gland is mildly enlarged.  24 hour I 131 uptake = 50.8% (normal 10-30%)  IMPRESSION: Imaging findings and I 131 uptake are consists with Graves disease.   Assessment & Plan:   1. Hyperthyroidism From Graves' disease -After a week off of methimazole her thyroid uptake and  scan confirms hyperthyroidism due to Graves' disease. -She is approached for better definitive therapy. She agrees with my recommendation of RAI thyroid ablation .  -This will likely lead to subsequent need for lifelong thyroid hormone replacement. Pt is made aware of this fact and willing to proceed. -I will arrange for this treatment to be administered as soon as possible.  I advised her to continue  low dose Propranolol for symptomatic relief. -She will return with repeat thyroid function tests in 7 weeks. - 20 minutes of time was spent on the care of this patient , 50% of which was applied for counseling on complications of thyrotoxicosis, options ,  and the need for definitive therapy to prevent these complications.  - I advised patient to maintain close follow up with Kennieth Rad, MD for primary care needs.  Follow up plan: Return in about 7 weeks (around 10/31/2015) for overactive thyroid, follow up with pre-visit labs.  Glade Lloyd, MD Phone: [phone]  Fax: [phone]   09/12/2015, 3:53 PM

## 2015-09-23 ENCOUNTER — Encounter (HOSPITAL_COMMUNITY): Payer: Self-pay

## 2015-09-23 ENCOUNTER — Encounter (HOSPITAL_COMMUNITY)
Admission: RE | Admit: 2015-09-23 | Discharge: 2015-09-23 | Disposition: A | Discharge status: Home / Self Care | Payer: BLUE CROSS/BLUE SHIELD | Source: Ambulatory Visit | Attending: "Endocrinology | Admitting: "Endocrinology

## 2015-09-23 DIAGNOSIS — E0590 Thyrotoxicosis, unspecified without thyrotoxic crisis or storm: Secondary | ICD-10-CM | POA: Diagnosis present

## 2015-09-23 MED ORDER — SODIUM IODIDE I 131 CAPSULE
15.0000 | Freq: Once | INTRAVENOUS | Status: AC | PRN
  Administered 2015-09-23: 15 via ORAL

## 2015-09-27 ENCOUNTER — Inpatient Hospital Stay (HOSPITAL_COMMUNITY): Admission: RE | Admit: 2015-09-27 | Payer: Self-pay | Source: Ambulatory Visit

## 2015-10-28 ENCOUNTER — Other Ambulatory Visit: Payer: Self-pay | Admitting: "Endocrinology

## 2015-10-29 LAB — TSH: TSH: 0.01 mIU/L — ABNORMAL LOW

## 2015-10-29 LAB — T4, FREE: Free T4: 0.8 ng/dL (ref 0.8–1.8)

## 2015-10-31 ENCOUNTER — Encounter: Payer: Self-pay | Admitting: "Endocrinology

## 2015-10-31 ENCOUNTER — Ambulatory Visit (INDEPENDENT_AMBULATORY_CARE_PROVIDER_SITE_OTHER): Payer: BLUE CROSS/BLUE SHIELD | Admitting: "Endocrinology

## 2015-10-31 VITALS — BP 126/76 | HR 57 | Ht 68.5 in | Wt 107.0 lb

## 2015-10-31 DIAGNOSIS — E032 Hypothyroidism due to medicaments and other exogenous substances: Secondary | ICD-10-CM

## 2015-10-31 MED ORDER — LEVOTHYROXINE SODIUM 25 MCG PO TABS: 25.0000 ug | ORAL_TABLET | Freq: Every day | ORAL | Status: DC

## 2015-10-31 NOTE — Progress Notes (Signed)
Subjective:    Patient ID: Brooke Kerr. Brooke Kerr, female    DOB: 1965/11/12, PCP Brooke Rad, MD.   Past Medical History  Diagnosis Date  . Anxiety   . Depression   . Headache(784.0)   . UTI (lower urinary tract infection)    Past Surgical History  Procedure Laterality Date  . Abdominal hysterectomy     Social History   Social History  . Marital Status: Unknown    Spouse Name: N/A  . Number of Children: N/A  . Years of Education: N/A   Social History Main Topics  . Smoking status: Current Every Day Smoker  . Smokeless tobacco: Never Used     Comment: 3-6 cigs a day as of 12/13/2012  . Alcohol Use: No  . Drug Use: No  . Sexual Activity:    Partners: Male   Other Topics Concern  . None   Social History Narrative   Outpatient Encounter Prescriptions as of 10/31/2015  Medication Sig  . butalbital-acetaminophen-caffeine (FIORICET WITH CODEINE) 50-325-40-30 MG capsule Take 1 capsule by mouth every 4 (four) hours as needed for headache.  . levothyroxine (SYNTHROID, LEVOTHROID) 25 MCG tablet Take 1 tablet (25 mcg total) by mouth daily.  . Linaclotide (LINZESS) 145 MCG CAPS capsule Take 145 mcg by mouth daily.  Marland Kitchen topiramate (TOPAMAX) 200 MG tablet Take 200 mg by mouth 2 (two) times daily.  Marland Kitchen venlafaxine XR (EFFEXOR-XR) 75 MG 24 hr capsule Take 3 capsules (225 mg total) by mouth daily.  . [DISCONTINUED] propranolol (INDERAL) 40 MG tablet Take 40 mg by mouth 2 (two) times daily.   No facility-administered encounter medications on file as of 10/31/2015.   ALLERGIES: Allergies  Allergen Reactions  . Ambien [Zolpidem] Other (See Comments)    "Loopy"   VACCINATION STATUS:  There is no immunization history on file for this patient.  HPI  The patient presents today with a medical history as above, and is being seen in Follow-up for hyperthyroidism  status post RAI therapy   -She has no new complaints.  The patient has been dealing with symptoms of  Weight loss of 12 lbs,  palpitations, and tremors, and sleep disturbance associated with anxiety for 1 yr. These symptoms are progressively improving since her RAI therapy.  The patient denies  family history of thyroid dysfunction  , and  the patient denies personal history of goiter. She is on propranolol.    Review of Systems Constitutional: + weight loss, + fatigue, +objective hyperthermia, +sleep disturbance. Eyes: no blurry vision, no xerophthalmia ENT: no sore throat, no nodules palpated in throat, no dysphagia/odynophagia, no hoarseness Cardiovascular: no CP/SOB/palpitations/leg swelling Respiratory: no cough/SOB Gastrointestinal: no N/V/D/C Musculoskeletal: no muscle/joint aches Skin: no rashes Neurological: + tremors/numbness, +dizziness Psychiatric: + depression/anxiety  Objective:    BP 126/76 mmHg  Pulse 57  Ht 5' 8.5" (1.74 m)  Wt 107 lb (48.535 kg)  BMI 16.03 kg/m2  SpO2 97%  Wt Readings from Last 3 Encounters:  10/31/15 107 lb (48.535 kg)  09/12/15 110 lb (49.896 kg)  08/30/15 111 lb (50.349 kg)    Physical Exam  Constitutional: anxious affect, thin build, lost 12 lbs. in NAD Eyes: PERRLA, EOMI, no exophthalmos ENT: moist mucous membranes, no thyromegaly, no cervical lymphadenopathy Cardiovascular: Bradycardic, No MRG Respiratory: CTA B Gastrointestinal: abdomen soft, NT, ND, BS+ Musculoskeletal: no deformities, strength intact in all 4 Skin: moist, warm, no rashes Neurological:  +tremor with outstretched hands, DTR normal in all 4 Recent Results (from the past  2160 hour(s))  TSH     Status: Abnormal   Collection Time: 10/28/15 10:30 AM  Result Value Ref Range   TSH 0.01 (L) mIU/L    Comment:   Reference Range   > or = 20 Years  0.40-4.50   Pregnancy Range First trimester  0.26-2.66 Second trimester 0.55-2.73 Third trimester  0.43-2.91     T4, free     Status: None   Collection Time: 10/28/15 10:30 AM  Result Value Ref Range   Free T4 0.8 0.8 - 1.8 ng/dL     Assessment & Plan:   1. RAI induced hypothyroidism  Her thyroid function tests are consistent with treatment affect. Her free T4 is low at 0.8. Her TSH is still lacking at 0.01. -She will benefit from initiation of low-dose thyroid hormone replacement. -I will initiate levothyroxine 25 g by mouth every morning .  - We discussed about correct intake of levothyroxine, at fasting, with water, separated by at least 30 minutes from breakfast, and separated by more than 4 hours from calcium, iron, multivitamins, acid reflux medications (PPIs). -Patient is made aware of the fact that thyroid hormone replacement is needed for life, dose to be adjusted by periodic monitoring of thyroid function tests. -I advised her to discontinue propranolol due to clinical bradycardia. - I advised patient to maintain close follow up with Brooke Rad, MD for primary care needs.  Follow up plan: Return in about 3 months (around 01/31/2016) for follow up with pre-visit labs.  Brooke Lloyd, MD Phone: [phone]  Fax: [phone]   10/31/2015, 2:14 PM

## 2016-01-21 ENCOUNTER — Other Ambulatory Visit: Payer: Self-pay | Admitting: "Endocrinology

## 2016-01-28 ENCOUNTER — Other Ambulatory Visit: Payer: Self-pay | Admitting: "Endocrinology

## 2016-02-13 ENCOUNTER — Other Ambulatory Visit: Payer: Self-pay | Admitting: "Endocrinology

## 2016-02-13 LAB — TSH

## 2016-02-13 LAB — T4, FREE: Free T4: 0.3 ng/dL — ABNORMAL LOW (ref 0.8–1.8)

## 2016-02-20 ENCOUNTER — Encounter: Payer: Self-pay | Admitting: "Endocrinology

## 2016-02-20 ENCOUNTER — Ambulatory Visit (INDEPENDENT_AMBULATORY_CARE_PROVIDER_SITE_OTHER): Payer: BLUE CROSS/BLUE SHIELD | Admitting: "Endocrinology

## 2016-02-20 VITALS — BP 137/82 | HR 78 | Ht 68.5 in | Wt 108.0 lb

## 2016-02-20 DIAGNOSIS — E032 Hypothyroidism due to medicaments and other exogenous substances: Secondary | ICD-10-CM | POA: Diagnosis not present

## 2016-02-20 MED ORDER — LEVOTHYROXINE SODIUM 75 MCG PO TABS: 75.0000 ug | ORAL_TABLET | Freq: Every day | ORAL | 2 refills | Status: DC

## 2016-02-20 NOTE — Progress Notes (Signed)
Subjective:    Patient ID: Brooke Kerr. Saline, female    DOB: 27-Sep-1965, PCP Brooke Rad, MD.   Past Medical History:  Diagnosis Date  . Anxiety   . Depression   . Headache(784.0)   . UTI (lower urinary tract infection)    Past Surgical History:  Procedure Laterality Date  . ABDOMINAL HYSTERECTOMY     Social History   Social History  . Marital status: Unknown    Spouse name: N/A  . Number of children: N/A  . Years of education: N/A   Social History Main Topics  . Smoking status: Current Every Day Smoker  . Smokeless tobacco: Never Used     Comment: 3-6 cigs a day as of 12/13/2012  . Alcohol use No  . Drug use: No  . Sexual activity: Yes    Partners: Male   Other Topics Concern  . Not on file   Social History Narrative  . No narrative on file   Outpatient Encounter Prescriptions as of 02/20/2016  Medication Sig  . butalbital-acetaminophen-caffeine (FIORICET WITH CODEINE) 50-325-40-30 MG capsule Take 1 capsule by mouth every 4 (four) hours as needed for headache.  . levothyroxine (SYNTHROID, LEVOTHROID) 75 MCG tablet Take 1 tablet (75 mcg total) by mouth daily before breakfast.  . Linaclotide (LINZESS) 145 MCG CAPS capsule Take 145 mcg by mouth daily.  Marland Kitchen topiramate (TOPAMAX) 200 MG tablet Take 200 mg by mouth 2 (two) times daily.  Marland Kitchen venlafaxine XR (EFFEXOR-XR) 75 MG 24 hr capsule Take 3 capsules (225 mg total) by mouth daily.  . [DISCONTINUED] levothyroxine (SYNTHROID, LEVOTHROID) 25 MCG tablet TAKE 1 TABLET BY MOUTH DAILY   No facility-administered encounter medications on file as of 02/20/2016.    ALLERGIES: Allergies  Allergen Reactions  . Ambien [Zolpidem] Other (See Comments)    "Loopy"   VACCINATION STATUS:  There is no immunization history on file for this patient.  HPI  The patient presents today with a medical history as above, and is being seen in Follow-up for hyperthyroidism  status post RAI therapy   -She has no new complaints.   She denies  palpitations, tremors. - She still has sleep disturbance, her weight is stable since last visit.  She has lost 12 pounds overall.    The patient denies  family history of thyroid dysfunction  , and  the patient denies personal history of goiter. She is on propranolol.    Review of Systems Constitutional: + Steady weight , +sleep disturbance. Eyes: no blurry vision, no xerophthalmia ENT: no sore throat, no nodules palpated in throat, no dysphagia/odynophagia, no hoarseness Cardiovascular: no CP/SOB/palpitations/leg swelling Respiratory: no cough/SOB Gastrointestinal: no N/V/D/C Musculoskeletal: no muscle/joint aches Skin: no rashes Neurological: - tremors/numbness, +dizziness Psychiatric: + depression/anxiety  Objective:    BP 137/82   Pulse 78   Ht 5' 8.5" (1.74 m)   Wt 108 lb (49 kg)   BMI 16.18 kg/m   Wt Readings from Last 3 Encounters:  02/20/16 108 lb (49 kg)  10/31/15 107 lb (48.5 kg)  09/12/15 110 lb (49.9 kg)    Physical Exam  Constitutional: anxious affect, thin build, lost 12 lbs, but stable weight since last visit. in NAD Eyes: PERRLA, EOMI, no exophthalmos ENT: moist mucous membranes, no thyromegaly, no cervical lymphadenopathy Cardiovascular: No MRG Respiratory: CTA B Gastrointestinal: abdomen soft, NT, ND, BS+ Musculoskeletal: no deformities, strength intact in all 4 Skin: moist, warm, no rashes Neurological:  - tremors , DTR normal in all 4  Recent Results (from the past 2160 hour(s))  TSH     Status: Abnormal   Collection Time: 02/13/16  8:41 AM  Result Value Ref Range   TSH >150.00 (H) mIU/L    Comment:   Reference Range   > or = 20 Years  0.40-4.50   Pregnancy Range First trimester  0.26-2.66 Second trimester 0.55-2.73 Third trimester  0.43-2.91     T4, free     Status: Abnormal   Collection Time: 02/13/16  8:41 AM  Result Value Ref Range   Free T4 0.3 (L) 0.8 - 1.8 ng/dL    Assessment & Plan:   1. RAI induced hypothyroidism  -  Heart thyroid function tests are consistent with RAI induced hypothyroidism. She was initiated on low-dose levothyroxine last visit. -I will increase levothyroxine to 75  g by mouth every morning .  - We discussed about correct intake of levothyroxine, at fasting, with water, separated by at least 30 minutes from breakfast, and separated by more than 4 hours from calcium, iron, multivitamins, acid reflux medications (PPIs). -Patient is made aware of the fact that thyroid hormone replacement is needed for life, dose to be adjusted by periodic monitoring of thyroid function tests. -I advised her to discontinue propranolol due to clinical bradycardia. - I advised patient to maintain close follow up with Brooke Rad, MD for primary care needs.  Follow up plan: Return in about 3 months (around 05/21/2016) for follow up with pre-visit labs.  Glade Lloyd, MD Phone: [phone]  Fax: [phone]   02/20/2016, 2:43 PM

## 2016-03-04 ENCOUNTER — Telehealth: Payer: Self-pay | Admitting: "Endocrinology

## 2016-03-04 NOTE — Telephone Encounter (Signed)
Patient calling because she has been feeling terrible since she started taking the higher dose of Levothyroxine. She is very tired and also feels exteremly  Nauseous, which gets worse after she eats and then she has to vomit. This all began 5 days after she started the higher dose.

## 2016-03-04 NOTE — Telephone Encounter (Signed)
I spoke with her and advised her to lower her dose to 50 g. She will need a visit sooner than her scheduled visit. -She will do her labs and 4 weeks and return for a visit.

## 2016-04-06 ENCOUNTER — Telehealth: Payer: Self-pay | Admitting: "Endocrinology

## 2016-04-06 MED ORDER — LEVOTHYROXINE SODIUM 75 MCG PO TABS: 75.0000 ug | ORAL_TABLET | Freq: Every day | ORAL | 2 refills | Status: DC

## 2016-04-06 MED ORDER — LEVOTHYROXINE SODIUM 50 MCG PO TABS: 50.0000 ug | ORAL_TABLET | Freq: Every day | ORAL | 1 refills | Status: DC

## 2016-04-06 NOTE — Telephone Encounter (Signed)
Brooke Kerr is requesting a new RX be sent to her pharmacy.She is requesting Synthroid, not the generic and she is taking 36mcg.

## 2016-04-06 NOTE — Telephone Encounter (Signed)
Patient called earlier to request a new RX for her thyroid medication. She is currently taking 50 mcg of Levothyroxine. She is requesting 50 mcg of SYNTHROID. Her pharmacy called her and said we sent in 75 mcg of the levothyroxine.

## 2016-05-22 ENCOUNTER — Other Ambulatory Visit: Payer: Self-pay | Admitting: "Endocrinology

## 2016-05-22 LAB — T4, FREE: Free T4: 0.8 ng/dL (ref 0.8–1.8)

## 2016-05-22 LAB — TSH: TSH: 43.75 mIU/L — ABNORMAL HIGH

## 2016-05-26 ENCOUNTER — Ambulatory Visit (INDEPENDENT_AMBULATORY_CARE_PROVIDER_SITE_OTHER): Payer: BLUE CROSS/BLUE SHIELD | Admitting: "Endocrinology

## 2016-05-26 ENCOUNTER — Encounter: Payer: Self-pay | Admitting: "Endocrinology

## 2016-05-26 VITALS — BP 122/77 | HR 78 | Ht 68.5 in | Wt 113.0 lb

## 2016-05-26 DIAGNOSIS — E890 Postprocedural hypothyroidism: Secondary | ICD-10-CM | POA: Diagnosis not present

## 2016-05-26 MED ORDER — LEVOTHYROXINE SODIUM 75 MCG PO TABS: 75.0000 ug | ORAL_TABLET | Freq: Every day | ORAL | 2 refills | Status: DC

## 2016-05-26 NOTE — Progress Notes (Signed)
Subjective:    Patient ID: Brooke Kerr, female    DOB: [birth year], PCP Kennieth Rad, MD.   Past Medical History:  Diagnosis Date  . Anxiety   . Depression   . Headache(784.0)   . UTI (lower urinary tract infection)    Past Surgical History:  Procedure Laterality Date  . ABDOMINAL HYSTERECTOMY     Social History   Social History  . Marital status: Unknown    Spouse name: N/A  . Number of children: N/A  . Years of education: N/A   Social History Main Topics  . Smoking status: Current Every Day Smoker  . Smokeless tobacco: Never Used     Comment: 3-6 cigs a day as of 12/13/2012  . Alcohol use No  . Drug use: No  . Sexual activity: Yes    Partners: Male   Other Topics Concern  . None   Social History Narrative  . None   Outpatient Encounter Prescriptions as of 05/26/2016  Medication Sig  . butalbital-acetaminophen-caffeine (FIORICET WITH CODEINE) 50-325-40-30 MG capsule Take 1 capsule by mouth every 4 (four) hours as needed for headache.  . levothyroxine (SYNTHROID, LEVOTHROID) 75 MCG tablet Take 1 tablet (75 mcg total) by mouth daily before breakfast.  . Linaclotide (LINZESS) 145 MCG CAPS capsule Take 145 mcg by mouth daily.  Marland Kitchen topiramate (TOPAMAX) 200 MG tablet Take 200 mg by mouth 2 (two) times daily.  Marland Kitchen venlafaxine XR (EFFEXOR-XR) 75 MG 24 hr capsule Take 3 capsules (225 mg total) by mouth daily.  . [DISCONTINUED] levothyroxine (SYNTHROID, LEVOTHROID) 50 MCG tablet Take 1 tablet (50 mcg total) by mouth daily before breakfast.   No facility-administered encounter medications on file as of 05/26/2016.    ALLERGIES: Allergies  Allergen Reactions  . Ambien [Zolpidem] Other (See Comments)    "Loopy"   VACCINATION STATUS:  There is no immunization history on file for this patient.  HPI  The patient presents today with a medical history as above, and is being seen in Follow-up for hyperthyroidism  status post RAI therapy   -She Feels better, however  complains of cold intolerance. - She is on 50 g of Synthroid because she did not tolerate 75 g of levothyroxine. - She sleeps better.   She denies palpitations, tremors. -  she gained 5 pounds since last visit.   The patient denies  family history of thyroid dysfunction  , and  the patient denies personal history of goiter. She is on propranolol.    Review of Systems Constitutional: + Steady weight gain , +sleep disturbance. Eyes: no blurry vision, no xerophthalmia ENT: no sore throat, no nodules palpated in throat, no dysphagia/odynophagia, no hoarseness Cardiovascular: no CP/SOB/palpitations/leg swelling Respiratory: no cough/SOB Gastrointestinal: no N/V/D/C Musculoskeletal: no muscle/joint aches Skin: no rashes Neurological: - tremors/numbness, +dizziness Psychiatric: + depression/anxiety  Objective:    BP 122/77   Pulse 78   Ht 5' 8.5" (1.74 m)   Wt 113 lb (51.3 kg)   BMI 16.93 kg/m   Wt Readings from Last 3 Encounters:  05/26/16 113 lb (51.3 kg)  02/20/16 108 lb (49 kg)  10/31/15 107 lb (48.5 kg)    Physical Exam  Constitutional: anxious affect, thin build, lost 12 lbs, but stable weight since last visit. in NAD Eyes: PERRLA, EOMI, no exophthalmos ENT: moist mucous membranes, no thyromegaly, no cervical lymphadenopathy Cardiovascular: No MRG Respiratory: CTA B Gastrointestinal: abdomen soft, NT, ND, BS+ Musculoskeletal: no deformities, strength intact in all 4 Skin: moist, warm,  no rashes Neurological:  - tremors , DTR normal in all 4  Recent Results (from the past 2160 hour(s))  TSH     Status: Abnormal   Collection Time: 05/22/16 10:26 AM  Result Value Ref Range   TSH 43.75 (H) mIU/L    Comment:   Reference Range   > or = 20 Years  0.40-4.50   Pregnancy Range First trimester  0.26-2.66 Second trimester 0.55-2.73 Third trimester  0.43-2.91     T4, free     Status: None   Collection Time: 05/22/16 10:26 AM  Result Value Ref Range   Free T4 0.8  0.8 - 1.8 ng/dL    Assessment & Plan:   1. RAI induced hypothyroidism  - Her thyroid function tests are consistent with RAI induced hypothyroidism. She was initiated on levothyroxine  75  g by mouth every morning , She did not tolerate it. - She was switched to Synthroid at 50 g by mouth every morning. She was doing better, however she will need a higher dose. I will increase her Synthroid to 75 g by mouth every morning.  - We discussed about correct intake of levothyroxine, at fasting, with water, separated by at least 30 minutes from breakfast, and separated by more than 4 hours from calcium, iron, multivitamins, acid reflux medications (PPIs). -Patient is made aware of the fact that thyroid hormone replacement is needed for life, dose to be adjusted by periodic monitoring of thyroid function tests. -I advised her to discontinue propranolol due to clinical bradycardia. - I advised patient to maintain close follow up with Kennieth Rad, MD for primary care needs.  Follow up plan: Return in about 3 months (around 08/24/2016) for follow up with pre-visit labs.  Glade Lloyd, MD Phone: [phone]  Fax: [phone]   05/26/2016, 2:38 PM

## 2016-07-02 ENCOUNTER — Telehealth: Payer: Self-pay | Admitting: "Endocrinology

## 2016-07-02 MED ORDER — LEVOTHYROXINE SODIUM 75 MCG PO TABS: 75.0000 ug | ORAL_TABLET | Freq: Every day | ORAL | 2 refills | Status: DC

## 2016-07-02 NOTE — Telephone Encounter (Signed)
Patient left a message on voicemail requesting a refill/new RX for her synthroid and to make sure it was for 70mcg since it had been changed at her last visit.

## 2016-07-06 ENCOUNTER — Telehealth: Payer: Self-pay | Admitting: "Endocrinology

## 2016-07-06 ENCOUNTER — Other Ambulatory Visit: Payer: Self-pay

## 2016-07-06 MED ORDER — LEVOTHYROXINE SODIUM 75 MCG PO TABS: 75.0000 ug | ORAL_TABLET | Freq: Every day | ORAL | 2 refills | Status: DC

## 2016-07-06 NOTE — Telephone Encounter (Signed)
Error

## 2016-07-14 IMAGING — NM NM THYROID IMAGING W/ UPTAKE SINGLE (24 HR)
4 series · 4 of 4 positions shown · non-contrast
Comparison: None

CLINICAL DATA: Tremors. 10 pound weight loss. Trouble sleeping. Tsh
equal

EXAM:
THYROID SCAN AND UPTAKE - 24 HOURS
TECHNIQUE: Following the per oral administration of D-BYB sodium iodide, the
patient returned at 24 hours and uptake measurements were acquired
with the uptake probe centered on the neck. Thyroid imaging was
performed following the intravenous administration of the Dc-TTm
Pertechnetate.
RADIOPHARMACEUTICALS:  Seventeen microCuries D-BYB sodium iodide
orally and 10.0 mCi 7echnetium-AAm pertechnetate IV

[Series 1: ant w marker · 3.25mm/px · 1 of 1 slices shown]
[im 1/1]
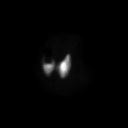

[Series 2: anterior · 3.25mm/px · 1 of 1 slices shown]
[im 1/1]
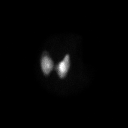

[Series 3: lao · 3.25mm/px · 1 of 1 slices shown]
[im 1/1]
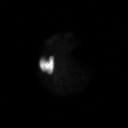

[Series 4: rao · 3.25mm/px · 1 of 1 slices shown]
[im 1/1]
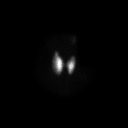

[4 of 4 positions shown; findings below may reference images not displayed]

FINDINGS: There is uniform uptake within the thyroid gland. The uptake is
high. No focal nodularity. The gland is mildly enlarged.

24 hour I 131 uptake = 50.8% (normal 10-30%)
IMPRESSION: Imaging findings and I 131 uptake are consists with Graves disease.

## 2016-08-18 ENCOUNTER — Other Ambulatory Visit: Payer: Self-pay | Admitting: "Endocrinology

## 2016-08-18 LAB — T4, FREE: FREE T4: 0.9 ng/dL (ref 0.8–1.8)

## 2016-08-18 LAB — TSH: TSH: 15.91 mIU/L — ABNORMAL HIGH

## 2016-08-24 ENCOUNTER — Encounter: Payer: Self-pay | Admitting: "Endocrinology

## 2016-08-24 ENCOUNTER — Ambulatory Visit (INDEPENDENT_AMBULATORY_CARE_PROVIDER_SITE_OTHER): Payer: BLUE CROSS/BLUE SHIELD | Admitting: "Endocrinology

## 2016-08-24 VITALS — BP 110/70 | HR 80 | Ht 68.5 in | Wt 108.0 lb

## 2016-08-24 DIAGNOSIS — E890 Postprocedural hypothyroidism: Secondary | ICD-10-CM | POA: Diagnosis not present

## 2016-08-24 MED ORDER — LEVOTHYROXINE SODIUM 88 MCG PO TABS: 88.0000 ug | ORAL_TABLET | Freq: Every day | ORAL | 1 refills | Status: DC

## 2016-08-24 NOTE — Progress Notes (Signed)
Subjective:    Patient ID: Brooke Kerr, female    DOB: 1966/02/28, PCP Kennieth Rad, MD.   Past Medical History:  Diagnosis Date  . Anxiety   . Depression   . Headache(784.0)   . UTI (lower urinary tract infection)    Past Surgical History:  Procedure Laterality Date  . ABDOMINAL HYSTERECTOMY     Social History   Social History  . Marital status: Unknown    Spouse name: N/A  . Number of children: N/A  . Years of education: N/A   Social History Main Topics  . Smoking status: Current Every Day Smoker  . Smokeless tobacco: Never Used     Comment: 3-6 cigs a day as of 12/13/2012  . Alcohol use No  . Drug use: No  . Sexual activity: Yes    Partners: Male   Other Topics Concern  . None   Social History Narrative  . None   Outpatient Encounter Prescriptions as of 08/24/2016  Medication Sig  . butalbital-acetaminophen-caffeine (FIORICET WITH CODEINE) 50-325-40-30 MG capsule Take 1 capsule by mouth every 4 (four) hours as needed for headache.  . levothyroxine (SYNTHROID, LEVOTHROID) 88 MCG tablet Take 1 tablet (88 mcg total) by mouth daily before breakfast.  . Linaclotide (LINZESS) 145 MCG CAPS capsule Take 145 mcg by mouth daily.  Marland Kitchen topiramate (TOPAMAX) 200 MG tablet Take 200 mg by mouth 2 (two) times daily.  Marland Kitchen venlafaxine XR (EFFEXOR-XR) 75 MG 24 hr capsule Take 3 capsules (225 mg total) by mouth daily.  . [DISCONTINUED] levothyroxine (SYNTHROID) 75 MCG tablet Take 1 tablet (75 mcg total) by mouth daily before breakfast.  . [DISCONTINUED] levothyroxine (SYNTHROID, LEVOTHROID) 75 MCG tablet Take 1 tablet (75 mcg total) by mouth daily before breakfast.   No facility-administered encounter medications on file as of 08/24/2016.    ALLERGIES: Allergies  Allergen Reactions  . Ambien [Zolpidem] Other (See Comments)    "Loopy"   VACCINATION STATUS:  There is no immunization history on file for this patient.  HPI  The patient presents today with a medical history as  above, and is being seen in Follow-up for hyperthyroidism  status post RAI therapy   -She Feels better, however complains of cold intolerance. - She is on 75 g of Synthroid because she did not tolerate levothyroxine. - She sleeps better.   She denies palpitations, tremors.   The patient denies  family history of thyroid dysfunction  , and  the patient denies personal history of goiter. She is on propranolol.    Review of Systems Constitutional: Feels better. Eyes: no blurry vision, no xerophthalmia ENT: no sore throat, no nodules palpated in throat, no dysphagia/odynophagia, no hoarseness Cardiovascular: no CP/SOB/palpitations/leg swelling Respiratory: no cough/SOB Gastrointestinal: no N/V/D/C Musculoskeletal: no muscle/joint aches Skin: no rashes Neurological: - tremors/numbness, +dizziness Psychiatric: + depression/anxiety  Objective:    BP 110/70   Pulse 80   Ht 5' 8.5" (1.74 m)   Wt 108 lb (49 kg)   BMI 16.18 kg/m   Wt Readings from Last 3 Encounters:  08/24/16 108 lb (49 kg)  05/26/16 113 lb (51.3 kg)  02/20/16 108 lb (49 kg)    Physical Exam  Constitutional: anxious affect, thin build in NAD Eyes: PERRLA, EOMI, no exophthalmos ENT: moist mucous membranes, no thyromegaly, no cervical lymphadenopathy Cardiovascular: No MRG Respiratory: CTA B Gastrointestinal: abdomen soft, NT, ND, BS+ Musculoskeletal: no deformities, strength intact in all 4 Skin: moist, warm, no rashes Neurological:  - tremors ,  DTR normal in all 4  Recent Results (from the past 2160 hour(s))  TSH     Status: Abnormal   Collection Time: 08/18/16 12:25 PM  Result Value Ref Range   TSH 15.91 (H) mIU/L    Comment:   Reference Range   > or = 20 Years  0.40-4.50   Pregnancy Range First trimester  0.26-2.66 Second trimester 0.55-2.73 Third trimester  0.43-2.91     T4, free     Status: None   Collection Time: 08/18/16 12:25 PM  Result Value Ref Range   Free T4 0.9 0.8 - 1.8 ng/dL     Assessment & Plan:   1. RAI induced hypothyroidism  - Her thyroid function tests are consistent with RAI induced hypothyroidism.  - She is responding to Synthroid therapy.  She would benefit from slight increase in her Synthroid dose. I will increase her Synthroid to 88 g by mouth every morning.  - We discussed about correct intake of levothyroxine, at fasting, with water, separated by at least 30 minutes from breakfast, and separated by more than 4 hours from calcium, iron, multivitamins, acid reflux medications (PPIs). -Patient is made aware of the fact that thyroid hormone replacement is needed for life, dose to be adjusted by periodic monitoring of thyroid function tests. -I advised her to discontinue propranolol due to clinical bradycardia. - I advised patient to maintain close follow up with Kennieth Rad, MD for primary care needs.  Follow up plan: Return in about 3 months (around 11/24/2016) for follow up with pre-visit labs.  Glade Lloyd, MD Phone: [phone]  Fax: [phone]   08/24/2016, 4:03 PM

## 2016-10-01 LAB — TSH: TSH: 10 u[IU]/mL — AB (ref <=5.90)

## 2016-10-23 ENCOUNTER — Ambulatory Visit (INDEPENDENT_AMBULATORY_CARE_PROVIDER_SITE_OTHER): Payer: BLUE CROSS/BLUE SHIELD | Admitting: "Endocrinology

## 2016-10-23 ENCOUNTER — Encounter: Payer: Self-pay | Admitting: "Endocrinology

## 2016-10-23 VITALS — BP 113/71 | HR 72 | Ht 68.5 in | Wt 111.0 lb

## 2016-10-23 DIAGNOSIS — E890 Postprocedural hypothyroidism: Secondary | ICD-10-CM | POA: Diagnosis not present

## 2016-10-23 MED ORDER — LEVOTHYROXINE SODIUM 100 MCG PO TABS: 100.0000 ug | ORAL_TABLET | Freq: Every day | ORAL | 1 refills | Status: DC

## 2016-10-23 NOTE — Progress Notes (Signed)
Subjective:    Patient ID: Brooke Kerr, female    DOB: 1966-06-05, PCP Kennieth Rad, MD.   Past Medical History:  Diagnosis Date  . Anxiety   . Depression   . Headache(784.0)   . UTI (lower urinary tract infection)    Past Surgical History:  Procedure Laterality Date  . ABDOMINAL HYSTERECTOMY     Social History   Social History  . Marital status: Unknown    Spouse name: N/A  . Number of children: N/A  . Years of education: N/A   Social History Main Topics  . Smoking status: Current Every Day Smoker  . Smokeless tobacco: Never Used     Comment: 3-6 cigs a day as of 12/13/2012  . Alcohol use No  . Drug use: No  . Sexual activity: Yes    Partners: Male   Other Topics Concern  . None   Social History Narrative  . None   Outpatient Encounter Prescriptions as of 10/23/2016  Medication Sig  . sertraline (ZOLOFT) 50 MG tablet Take 75 mg by mouth daily.  Marland Kitchen levothyroxine (SYNTHROID, LEVOTHROID) 100 MCG tablet Take 1 tablet (100 mcg total) by mouth daily before breakfast.  . topiramate (TOPAMAX) 200 MG tablet Take 100 mg by mouth 2 (two) times daily.  . [DISCONTINUED] butalbital-acetaminophen-caffeine (FIORICET WITH CODEINE) 50-325-40-30 MG capsule Take 1 capsule by mouth every 4 (four) hours as needed for headache.  . [DISCONTINUED] levothyroxine (SYNTHROID, LEVOTHROID) 88 MCG tablet Take 1 tablet (88 mcg total) by mouth daily before breakfast.  . [DISCONTINUED] Linaclotide (LINZESS) 145 MCG CAPS capsule Take 145 mcg by mouth daily.  . [DISCONTINUED] venlafaxine XR (EFFEXOR-XR) 75 MG 24 hr capsule Take 3 capsules (225 mg total) by mouth daily.   No facility-administered encounter medications on file as of 10/23/2016.    ALLERGIES: Allergies  Allergen Reactions  . Ambien [Zolpidem] Other (See Comments)    "Loopy"   VACCINATION STATUS:  There is no immunization history on file for this patient.  HPI  The patient presents today with a medical history as above,  and is being seen in Follow-up for RAI induced hypothyroidism.  She was given RAI for Graves' disease on 09/23/2015 . - She is currently on Synthroid 88 g by mouth every morning.  Reports compliance to this medication. - Since her last visit, she reportedly had hospitalization for altered mental status and was found to have a TSH of 30 along with free T4 of 1.29 ( normal 0.46- 1.46). - Most recent labs from a previous 18th 2018 showed TSH of 10, no associated free T4.  - She sleeps better.   She denies palpitations, tremors. She has history of chronic migraine headache associated with double vision currently following up with ophthalmology.    The patient denies  family history of thyroid dysfunction  , and  denies personal history of goiter.   Review of Systems Constitutional: Feels better. Eyes: no blurry vision, no xerophthalmia, + diplopia  ENT: no sore throat, no nodules palpated in throat, no dysphagia/odynophagia, no hoarseness Cardiovascular: no CP/SOB/palpitations/leg swelling Respiratory: no cough/SOB Gastrointestinal: no N/V/D/C Musculoskeletal: no muscle/joint aches Skin: no rashes Neurological: - tremors/numbness, +dizziness Psychiatric: + depression/anxiety  Objective:    BP 113/71   Pulse 72   Ht 5' 8.5" (1.74 m)   Wt 111 lb (50.3 kg)   BMI 16.63 kg/m   Wt Readings from Last 3 Encounters:  10/23/16 111 lb (50.3 kg)  08/24/16 108 lb (49 kg)  05/26/16 113 lb (51.3 kg)    Physical Exam  Constitutional: anxious affect, thin build in NAD Eyes: PERRLA, EOMI, no exophthalmos ENT: moist mucous membranes, no thyromegaly, no cervical lymphadenopathy Cardiovascular: No MRG Respiratory: CTA B Gastrointestinal: abdomen soft, NT, ND, BS+ Musculoskeletal: no deformities, strength intact in all 4 Skin: moist, warm, no rashes Neurological:  - tremors , DTR normal in all 4  Recent Results (from the past 2160 hour(s))  TSH     Status: Abnormal   Collection Time:  08/18/16 12:25 PM  Result Value Ref Range   TSH 15.91 (H) mIU/L    Comment:   Reference Range   > or = 20 Years  0.40-4.50   Pregnancy Range First trimester  0.26-2.66 Second trimester 0.55-2.73 Third trimester  0.43-2.91     T4, free     Status: None   Collection Time: 08/18/16 12:25 PM  Result Value Ref Range   Free T4 0.9 0.8 - 1.8 ng/dL  TSH     Status: Abnormal   Collection Time: 10/01/16 12:00 AM  Result Value Ref Range   TSH 10.00 (A) 0.41 - 5.90 uIU/mL    Comment: FT4 1.29 ( Normal 0.76-1.46).    Assessment & Plan:   1. RAI induced hypothyroidism   - She is responding to Synthroid therapy.  Given her still above target TSH of 10 associated with mid range free T4 at 1.29, she will benefit from slight increase in her Synthroid dose. I will increase her Synthroid to 100 g by mouth every morning.  - We discussed about correct intake of levothyroxine, at fasting, with water, separated by at least 30 minutes from breakfast, and separated by more than 4 hours from calcium, iron, multivitamins, acid reflux medications (PPIs). -Patient is made aware of the fact that thyroid hormone replacement is needed for life, dose to be adjusted by periodic monitoring of thyroid function tests.  - I had a long discussion with the patient that her hyperthyroidism from Graves' disease is treated and currently requiring thyroid hormone replacement which is being progressively adjusted, currently close to target.  The concurrent diplopia she is complaining of is out of proportion  to her clinical progress from thyroid dysfunction. Evidence shows that even after complete [place] of hyperthyroidism some pathology of Graves orbitopathy persist and may require some local treatment.  I advised her to continue follow up with ophthalmology for possible Graves ophthalmolopathy/diplopia .   - I advised patient to maintain close follow up with Kennieth Rad, MD for primary care needs.  Follow up  plan: Return in about 6 months (around 04/25/2017) for follow up with pre-visit labs.  Glade Lloyd, MD Phone: [phone]  Fax: [phone]   10/23/2016, 9:46 AM

## 2016-11-25 ENCOUNTER — Ambulatory Visit: Payer: BLUE CROSS/BLUE SHIELD | Admitting: "Endocrinology

## 2017-04-23 ENCOUNTER — Encounter: Payer: Self-pay | Admitting: "Endocrinology

## 2017-04-23 ENCOUNTER — Ambulatory Visit: Payer: BLUE CROSS/BLUE SHIELD | Admitting: "Endocrinology

## 2017-12-31 ENCOUNTER — Ambulatory Visit (INDEPENDENT_AMBULATORY_CARE_PROVIDER_SITE_OTHER): Payer: Self-pay | Admitting: Internal Medicine

## 2020-05-30 ENCOUNTER — Ambulatory Visit (INDEPENDENT_AMBULATORY_CARE_PROVIDER_SITE_OTHER): Payer: BLUE CROSS/BLUE SHIELD | Admitting: Gastroenterology

## 2020-09-02 ENCOUNTER — Ambulatory Visit (INDEPENDENT_AMBULATORY_CARE_PROVIDER_SITE_OTHER): Payer: BC Managed Care – PPO | Admitting: Gastroenterology

## 2020-09-02 ENCOUNTER — Telehealth (INDEPENDENT_AMBULATORY_CARE_PROVIDER_SITE_OTHER): Payer: Self-pay

## 2020-09-02 ENCOUNTER — Other Ambulatory Visit: Payer: Self-pay

## 2020-09-02 ENCOUNTER — Other Ambulatory Visit (INDEPENDENT_AMBULATORY_CARE_PROVIDER_SITE_OTHER): Payer: Self-pay

## 2020-09-02 ENCOUNTER — Encounter (INDEPENDENT_AMBULATORY_CARE_PROVIDER_SITE_OTHER): Payer: Self-pay | Admitting: Gastroenterology

## 2020-09-02 ENCOUNTER — Encounter (INDEPENDENT_AMBULATORY_CARE_PROVIDER_SITE_OTHER): Payer: Self-pay

## 2020-09-02 DIAGNOSIS — K582 Mixed irritable bowel syndrome: Secondary | ICD-10-CM

## 2020-09-02 DIAGNOSIS — R1013 Epigastric pain: Secondary | ICD-10-CM

## 2020-09-02 DIAGNOSIS — G8929 Other chronic pain: Secondary | ICD-10-CM | POA: Diagnosis not present

## 2020-09-02 DIAGNOSIS — Z1211 Encounter for screening for malignant neoplasm of colon: Secondary | ICD-10-CM

## 2020-09-02 DIAGNOSIS — K589 Irritable bowel syndrome without diarrhea: Secondary | ICD-10-CM | POA: Insufficient documentation

## 2020-09-02 DIAGNOSIS — K219 Gastro-esophageal reflux disease without esophagitis: Secondary | ICD-10-CM

## 2020-09-02 MED ORDER — SUTAB 1479-225-188 MG PO TABS: 1.0000 | ORAL_TABLET | Freq: Once | ORAL | 0 refills | Status: AC

## 2020-09-02 MED ORDER — ESOMEPRAZOLE MAGNESIUM 20 MG PO CPDR: 20.0000 mg | DELAYED_RELEASE_CAPSULE | Freq: Every day | ORAL | 3 refills | Status: AC

## 2020-09-02 MED ORDER — PEG 3350-KCL-NA BICARB-NACL 420 G PO SOLR: 4000.0000 mL | ORAL | 0 refills | Status: DC

## 2020-09-02 NOTE — Telephone Encounter (Signed)
Brooke Kerr, CMA  

## 2020-09-02 NOTE — Patient Instructions (Addendum)
Schedule EGD and colonoscopy Explained presumed etiology of IBS symptoms. Patient was counseled about the benefit of implementing a low FODMAP to improve symptoms and recurrent episodes. A dietary list was provided to the patient. Also, the patient was counseled about the benefit of avoiding stressing situations and potential environmental triggers leading to symptomatology. Perform blood workup Start taking Miralax 1 cap every day for one week. If bowel movements do not improve, increase to 1 cup every 12 hours. If after two weeks there is no improvement, increase to 1 cup every 8 hours Start Nexium 20 mg qday

## 2020-09-02 NOTE — H&P (View-Only) (Signed)
Maylon Peppers, M.D. Gastroenterology & Hepatology [facility] [address] [place], [place] [address] Primary Care Physician: Kennieth Rad, MD [facility] [place] [id number]  Referring MD: PCP  Chief Complaint: Change in bowel movements, GERD and colorectal cancer screening  History of Present Illness: Brooke Kerr is a 55 y.o. 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  Patient reports that she had a long standing history of GERD for close to 10 years and was on Nexium 40 mg for a long period of time, she lives for close to 5 years. She states that she tolerated the medication well and it completely controlled her episodes of heartburn while taking it.  However, she was advised to stop taking it 5 years ago due to concern of possible long term side effects (osteoporosis) due to the medication. She tried Zantac instead but did not help so she did not take any medication for this. She reports that since she stopped taking Nexium she has noticed having chronic recurrent episodes of heartburn 1-2 times a week depending on what she eats - especially acidy foods, tomatoes or spicy.  She denies have any dysphagia or odynophagia but states that her heartburn episodes are usually present at night.  She also reports having a history of constipation for a few decades. She reports that she tried Linzess in the past but had diarrhea so stopped it. She states for the last 2 years she has presented alternating change in bowel movement consistency - she has some days that she has 4-5 times a day watery bowel movements with tenesmus (happens 3 days a month) but does not take any antidiarrheals for this. Also has some days when she can last up to 3  days without having a bowel movement in some occasions, which comes along bloating. Does not take any laxatives. She has presented persistent episodes of epigastric dull abdominal pain.  She has occasionally fresh blood in her stool when she has to strain significantly but the amount of blood is very scant.  The patient denies having any nausea, vomiting, fever, chills,  melena, hematemesis,  diarrhea, jaundice, pruritus. Has increased her weight since she stopped the use of topiramate for migraines.  Last EGD:> 10 years ago, normal per patient (no reports available) Last Colonoscopy: 20  Years ago, had it performed for melena per patient  (no reports available)  FHx: neg for any gastrointestinal/liver disease, breast cancer mother Social: smokes 1/2 pack a day, neg alcohol or illicit drug use Surgical: hysterectomy  Past Medical History: Past Medical History:  Diagnosis Date  . Anxiety   . Depression   . Headache(784.0)   . UTI (lower urinary tract infection)     Past Surgical History: Past Surgical History:  Procedure Laterality Date  . ABDOMINAL HYSTERECTOMY      Family History: Family History  Problem Relation Age of Onset  . Dementia Maternal Grandfather   . Depression Maternal Grandmother   . Seizures Son   . ADD / ADHD Neg Hx   . Alcohol abuse Neg Hx   . Drug abuse Neg Hx   . Bipolar disorder Neg Hx   . Anxiety disorder Neg Hx   . OCD Neg Hx   . Paranoid behavior Neg Hx   . Schizophrenia Neg Hx   .  Sexual abuse Neg Hx   . Physical abuse Neg Hx     Social History: Social History   Tobacco Use  Smoking Status Current Every Day Smoker  . Packs/day: 0.50  Smokeless Tobacco Never Used  Tobacco Comment   3-6 cigs a day as of 12/13/2012   Social History   Substance and Sexual Activity  Alcohol Use Yes   Comment: occasionally   Social History   Substance and Sexual Activity  Drug Use No    Allergies: Allergies  Allergen Reactions  . Ambien [Zolpidem]  Other (See Comments)    "Loopy"    Medications: Current Outpatient Medications  Medication Sig Dispense Refill  . levothyroxine (SYNTHROID, LEVOTHROID) 100 MCG tablet Take 1 tablet (100 mcg total) by mouth daily before breakfast. (Patient taking differently: Take 88 mcg by mouth daily before breakfast.) 90 tablet 1  . predniSONE (DELTASONE) 5 MG tablet Take 5 mg by mouth daily with breakfast.     No current facility-administered medications for this visit.    Review of Systems: GENERAL: negative for malaise, night sweats HEENT: No changes in hearing or vision, no nose bleeds or other nasal problems. NECK: Negative for lumps, goiter, pain and significant neck swelling RESPIRATORY: Negative for cough, wheezing CARDIOVASCULAR: Negative for chest pain, leg swelling, palpitations, orthopnea GI: SEE HPI MUSCULOSKELETAL: Negative for joint pain or swelling, back pain, and muscle pain. SKIN: Negative for lesions, rash PSYCH: Negative for sleep disturbance, mood disorder and recent psychosocial stressors. HEMATOLOGY Negative for prolonged bleeding, bruising easily, and swollen nodes. ENDOCRINE: Negative for cold or heat intolerance, polyuria, polydipsia and goiter. NEURO: negative for tremor, gait imbalance, syncope and seizures. The remainder of the review of systems is noncontributory.   Physical Exam: BP 138/79 (BP Location: Left Arm, Patient Position: Sitting, Cuff Size: Small)   Pulse 71   Temp 97.8 F (36.6 C) (Oral)   Ht 5' 8.5" (1.74 m)   Wt 117 lb (53.1 kg)   BMI 17.53 kg/m  GENERAL: The patient is AO x3, in no acute distress. HEENT: Head is normocephalic and atraumatic. EOMI are intact. Mouth is well hydrated and without lesions. NECK: Supple. No masses LUNGS: Clear to auscultation. No presence of rhonchi/wheezing/rales. Adequate chest expansion HEART: RRR, normal s1 and s2. ABDOMEN: mildly tender to palpation in R paraumbilical area, no guarding, no peritoneal signs, and  nondistended. BS +. No masses. EXTREMITIES: Without any cyanosis, clubbing, rash, lesions or edema. NEUROLOGIC: AOx3, no focal motor deficit. SKIN: no jaundice, no rashes   Imaging/Labs: as above  I personally reviewed and interpreted the available labs, imaging and endoscopic files.  Impression and Plan: Brooke Kerr is a 55 y.o. 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  Patient has presented chronic episodes of heartburn which initially responded to Nexium but this medication was discontinued due to concern for possible long-term side effects.  She tolerated the medication adequately.  I had a thorough discussion with the patient regarding the use of PPIs.  Patient was re-assured regarding PPI use and safety of when appropriate indications in question. Most recent studies on PPI therapy that association of symptoms is not equivalent to causation and overall association with for example osteoporosis is weak and based on observational studies. When PPI use is indicated, it is safe to proceed with therapy and titrate dosing/use based on symptom response.  Moreover, I explained to her that if GERD remains uncontrolled, there is a high likelihood she can present complications such as strictures, esophagitis or esophageal malignancy.  She understood and agreed.  I will start her on Nexium, we will attempt to try 20 mg dosing at this moment and assess symptom control.  Given the chronicity of her symptoms off PPI, I consider it adequate to perform an EGD to screen for Barrett's esophagus.  Patient is also presenting chronic symptoms of abdominal pain, bloating and change in her bowel movements.  She has not presented any red flag signs.  Her symptoms are classic for IBS M, I considered she would benefit from implementing a low  FODMAP diet, but also she will benefit from starting MiraLAX 1 capful every day whenever she presents episode of constipation.  I will check for celiac serologies today.  Investigation of other organic GI sources explaining her symptoms will be performed with esophagogastroduodenospy, will need to obtain small bowel biopsies at the same time.  Finally, the patient is due for colorectal cancer screening.  She is at average risk.  We will proceed with a colonoscopy.  - Schedule EGD with SB biopsies and colonoscopy - Explained presumed etiology of IBS symptoms. Patient was counseled about the benefit of implementing a low FODMAP to improve symptoms and recurrent episodes. A dietary list was provided to the patient. Also, the patient was counseled about the benefit of avoiding stressing situations and potential environmental triggers leading to symptomatology. - Check TTG IgA - Start taking Miralax 1 cap every day for one week. If bowel movements do not improve, increase to 1 cup every 12 hours. If after two weeks there is no improvement, increase to 1 cup every 8 hours - Start Nexium 20 mg qday  All questions were answered.      Maylon Peppers, MD Gastroenterology and Hepatology [facility]

## 2020-09-02 NOTE — Progress Notes (Signed)
Brooke Kerr, M.D. Gastroenterology & Hepatology [facility] [address] [place], [place] [address] Primary Care Physician: Kennieth Rad, MD [facility] [place] [id number]  Referring MD: PCP  Chief Complaint: Change in bowel movements, GERD and colorectal cancer screening  History of Present Illness: Brooke Kerr is a 55 y.o. 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  Patient reports that she had a long standing history of GERD for close to 10 years and was on Nexium 40 mg for a long period of time, she lives for close to 5 years. She states that she tolerated the medication well and it completely controlled her episodes of heartburn while taking it.  However, she was advised to stop taking it 5 years ago due to concern of possible long term side effects (osteoporosis) due to the medication. She tried Zantac instead but did not help so she did not take any medication for this. She reports that since she stopped taking Nexium she has noticed having chronic recurrent episodes of heartburn 1-2 times a week depending on what she eats - especially acidy foods, tomatoes or spicy.  She denies have any dysphagia or odynophagia but states that her heartburn episodes are usually present at night.  She also reports having a history of constipation for a few decades. She reports that she tried Linzess in the past but had diarrhea so stopped it. She states for the last 2 years she has presented alternating change in bowel movement consistency - she has some days that she has 4-5 times a day watery bowel movements with tenesmus (happens 3 days a month) but does not take any antidiarrheals for this. Also has some days when she can last up to 3  days without having a bowel movement in some occasions, which comes along bloating. Does not take any laxatives. She has presented persistent episodes of epigastric dull abdominal pain.  She has occasionally fresh blood in her stool when she has to strain significantly but the amount of blood is very scant.  The patient denies having any nausea, vomiting, fever, chills,  melena, hematemesis,  diarrhea, jaundice, pruritus. Has increased her weight since she stopped the use of topiramate for migraines.  Last EGD:> 10 years ago, normal per patient (no reports available) Last Colonoscopy: 20  Years ago, had it performed for melena per patient  (no reports available)  FHx: neg for any gastrointestinal/liver disease, breast cancer mother Social: smokes 1/2 pack a day, neg alcohol or illicit drug use Surgical: hysterectomy  Past Medical History: Past Medical History:  Diagnosis Date  . Anxiety   . Depression   . Headache(784.0)   . UTI (lower urinary tract infection)     Past Surgical History: Past Surgical History:  Procedure Laterality Date  . ABDOMINAL HYSTERECTOMY      Family History: Family History  Problem Relation Age of Onset  . Dementia Maternal Grandfather   . Depression Maternal Grandmother   . Seizures Son   . ADD / ADHD Neg Hx   . Alcohol abuse Neg Hx   . Drug abuse Neg Hx   . Bipolar disorder Neg Hx   . Anxiety disorder Neg Hx   . OCD Neg Hx   . Paranoid behavior Neg Hx   . Schizophrenia Neg Hx   .  Sexual abuse Neg Hx   . Physical abuse Neg Hx     Social History: Social History   Tobacco Use  Smoking Status Current Every Day Smoker  . Packs/day: 0.50  Smokeless Tobacco Never Used  Tobacco Comment   3-6 cigs a day as of 12/13/2012   Social History   Substance and Sexual Activity  Alcohol Use Yes   Comment: occasionally   Social History   Substance and Sexual Activity  Drug Use No    Allergies: Allergies  Allergen Reactions  . Ambien [Zolpidem]  Other (See Comments)    "Loopy"    Medications: Current Outpatient Medications  Medication Sig Dispense Refill  . levothyroxine (SYNTHROID, LEVOTHROID) 100 MCG tablet Take 1 tablet (100 mcg total) by mouth daily before breakfast. (Patient taking differently: Take 88 mcg by mouth daily before breakfast.) 90 tablet 1  . predniSONE (DELTASONE) 5 MG tablet Take 5 mg by mouth daily with breakfast.     No current facility-administered medications for this visit.    Review of Systems: GENERAL: negative for malaise, night sweats HEENT: No changes in hearing or vision, no nose bleeds or other nasal problems. NECK: Negative for lumps, goiter, pain and significant neck swelling RESPIRATORY: Negative for cough, wheezing CARDIOVASCULAR: Negative for chest pain, leg swelling, palpitations, orthopnea GI: SEE HPI MUSCULOSKELETAL: Negative for joint pain or swelling, back pain, and muscle pain. SKIN: Negative for lesions, rash PSYCH: Negative for sleep disturbance, mood disorder and recent psychosocial stressors. HEMATOLOGY Negative for prolonged bleeding, bruising easily, and swollen nodes. ENDOCRINE: Negative for cold or heat intolerance, polyuria, polydipsia and goiter. NEURO: negative for tremor, gait imbalance, syncope and seizures. The remainder of the review of systems is noncontributory.   Physical Exam: BP 138/79 (BP Location: Left Arm, Patient Position: Sitting, Cuff Size: Small)   Pulse 71   Temp 97.8 F (36.6 C) (Oral)   Ht 5' 8.5" (1.74 m)   Wt 117 lb (53.1 kg)   BMI 17.53 kg/m  GENERAL: The patient is AO x3, in no acute distress. HEENT: Head is normocephalic and atraumatic. EOMI are intact. Mouth is well hydrated and without lesions. NECK: Supple. No masses LUNGS: Clear to auscultation. No presence of rhonchi/wheezing/rales. Adequate chest expansion HEART: RRR, normal s1 and s2. ABDOMEN: mildly tender to palpation in R paraumbilical area, no guarding, no peritoneal signs, and  nondistended. BS +. No masses. EXTREMITIES: Without any cyanosis, clubbing, rash, lesions or edema. NEUROLOGIC: AOx3, no focal motor deficit. SKIN: no jaundice, no rashes   Imaging/Labs: as above  I personally reviewed and interpreted the available labs, imaging and endoscopic files.  Impression and Plan: Brooke Kerr. Falk is a 55 y.o. 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  Patient has presented chronic episodes of heartburn which initially responded to Nexium but this medication was discontinued due to concern for possible long-term side effects.  She tolerated the medication adequately.  I had a thorough discussion with the patient regarding the use of PPIs.  Patient was re-assured regarding PPI use and safety of when appropriate indications in question. Most recent studies on PPI therapy that association of symptoms is not equivalent to causation and overall association with for example osteoporosis is weak and based on observational studies. When PPI use is indicated, it is safe to proceed with therapy and titrate dosing/use based on symptom response.  Moreover, I explained to her that if GERD remains uncontrolled, there is a high likelihood she can present complications such as strictures, esophagitis or esophageal malignancy.  She understood and agreed.  I will start her on Nexium, we will attempt to try 20 mg dosing at this moment and assess symptom control.  Given the chronicity of her symptoms off PPI, I consider it adequate to perform an EGD to screen for Barrett's esophagus.  Patient is also presenting chronic symptoms of abdominal pain, bloating and change in her bowel movements.  She has not presented any red flag signs.  Her symptoms are classic for IBS M, I considered she would benefit from implementing a low  FODMAP diet, but also she will benefit from starting MiraLAX 1 capful every day whenever she presents episode of constipation.  I will check for celiac serologies today.  Investigation of other organic GI sources explaining her symptoms will be performed with esophagogastroduodenospy, will need to obtain small bowel biopsies at the same time.  Finally, the patient is due for colorectal cancer screening.  She is at average risk.  We will proceed with a colonoscopy.  - Schedule EGD with SB biopsies and colonoscopy - Explained presumed etiology of IBS symptoms. Patient was counseled about the benefit of implementing a low FODMAP to improve symptoms and recurrent episodes. A dietary list was provided to the patient. Also, the patient was counseled about the benefit of avoiding stressing situations and potential environmental triggers leading to symptomatology. - Check TTG IgA - Start taking Miralax 1 cap every day for one week. If bowel movements do not improve, increase to 1 cup every 12 hours. If after two weeks there is no improvement, increase to 1 cup every 8 hours - Start Nexium 20 mg qday  All questions were answered.      Brooke Peppers, MD Gastroenterology and Hepatology [facility]

## 2020-09-03 LAB — TISSUE TRANSGLUTAMINASE, IGA: (tTG) Ab, IgA: <1 U/mL

## 2020-09-03 LAB — IGA: Immunoglobulin A: 137 mg/dL (ref 47–310)

## 2020-09-16 ENCOUNTER — Other Ambulatory Visit (HOSPITAL_COMMUNITY)
Admission: RE | Admit: 2020-09-16 | Discharge: 2020-09-16 | Disposition: A | Discharge status: Home / Self Care | Payer: BC Managed Care – PPO | Source: Ambulatory Visit | Attending: Gastroenterology | Admitting: Gastroenterology

## 2020-09-16 ENCOUNTER — Other Ambulatory Visit: Payer: Self-pay

## 2020-09-16 DIAGNOSIS — Z888 Allergy status to other drugs, medicaments and biological substances status: Secondary | ICD-10-CM | POA: Diagnosis not present

## 2020-09-16 DIAGNOSIS — Z01812 Encounter for preprocedural laboratory examination: Secondary | ICD-10-CM | POA: Insufficient documentation

## 2020-09-16 DIAGNOSIS — Z923 Personal history of irradiation: Secondary | ICD-10-CM | POA: Diagnosis not present

## 2020-09-16 DIAGNOSIS — Z1211 Encounter for screening for malignant neoplasm of colon: Secondary | ICD-10-CM | POA: Diagnosis not present

## 2020-09-16 DIAGNOSIS — K648 Other hemorrhoids: Secondary | ICD-10-CM | POA: Diagnosis not present

## 2020-09-16 DIAGNOSIS — D120 Benign neoplasm of cecum: Secondary | ICD-10-CM | POA: Diagnosis not present

## 2020-09-16 DIAGNOSIS — K2950 Unspecified chronic gastritis without bleeding: Secondary | ICD-10-CM | POA: Diagnosis not present

## 2020-09-16 DIAGNOSIS — E890 Postprocedural hypothyroidism: Secondary | ICD-10-CM | POA: Diagnosis not present

## 2020-09-16 DIAGNOSIS — F17210 Nicotine dependence, cigarettes, uncomplicated: Secondary | ICD-10-CM | POA: Diagnosis not present

## 2020-09-16 DIAGNOSIS — R197 Diarrhea, unspecified: Secondary | ICD-10-CM | POA: Diagnosis not present

## 2020-09-16 DIAGNOSIS — Z20822 Contact with and (suspected) exposure to covid-19: Secondary | ICD-10-CM | POA: Insufficient documentation

## 2020-09-16 DIAGNOSIS — Z79890 Hormone replacement therapy (postmenopausal): Secondary | ICD-10-CM | POA: Diagnosis not present

## 2020-09-16 DIAGNOSIS — K219 Gastro-esophageal reflux disease without esophagitis: Secondary | ICD-10-CM | POA: Diagnosis not present

## 2020-09-16 DIAGNOSIS — K3189 Other diseases of stomach and duodenum: Secondary | ICD-10-CM | POA: Diagnosis not present

## 2020-09-17 LAB — SARS CORONAVIRUS 2 (TAT 6-24 HRS): SARS Coronavirus 2: NEGATIVE

## 2020-09-18 ENCOUNTER — Encounter (HOSPITAL_COMMUNITY)
Admission: RE | Disposition: A | Discharge status: Home / Self Care | Payer: Self-pay | Source: Home / Self Care | Attending: Gastroenterology

## 2020-09-18 ENCOUNTER — Ambulatory Visit (HOSPITAL_COMMUNITY): Payer: BC Managed Care – PPO | Admitting: Anesthesiology

## 2020-09-18 ENCOUNTER — Other Ambulatory Visit: Payer: Self-pay

## 2020-09-18 ENCOUNTER — Ambulatory Visit (HOSPITAL_COMMUNITY)
Admission: RE | Admit: 2020-09-18 | Discharge: 2020-09-18 | Disposition: A | Discharge status: Home / Self Care | Payer: BC Managed Care – PPO | Attending: Gastroenterology | Admitting: Gastroenterology

## 2020-09-18 ENCOUNTER — Encounter (HOSPITAL_COMMUNITY): Payer: Self-pay | Admitting: Gastroenterology

## 2020-09-18 DIAGNOSIS — D120 Benign neoplasm of cecum: Secondary | ICD-10-CM | POA: Insufficient documentation

## 2020-09-18 DIAGNOSIS — D123 Benign neoplasm of transverse colon: Secondary | ICD-10-CM

## 2020-09-18 DIAGNOSIS — Z1211 Encounter for screening for malignant neoplasm of colon: Secondary | ICD-10-CM | POA: Insufficient documentation

## 2020-09-18 DIAGNOSIS — D122 Benign neoplasm of ascending colon: Secondary | ICD-10-CM

## 2020-09-18 DIAGNOSIS — Z20822 Contact with and (suspected) exposure to covid-19: Secondary | ICD-10-CM | POA: Insufficient documentation

## 2020-09-18 DIAGNOSIS — R197 Diarrhea, unspecified: Secondary | ICD-10-CM | POA: Diagnosis not present

## 2020-09-18 DIAGNOSIS — E890 Postprocedural hypothyroidism: Secondary | ICD-10-CM | POA: Insufficient documentation

## 2020-09-18 DIAGNOSIS — K644 Residual hemorrhoidal skin tags: Secondary | ICD-10-CM

## 2020-09-18 DIAGNOSIS — K3189 Other diseases of stomach and duodenum: Secondary | ICD-10-CM | POA: Insufficient documentation

## 2020-09-18 DIAGNOSIS — Z923 Personal history of irradiation: Secondary | ICD-10-CM | POA: Insufficient documentation

## 2020-09-18 DIAGNOSIS — Z79890 Hormone replacement therapy (postmenopausal): Secondary | ICD-10-CM | POA: Insufficient documentation

## 2020-09-18 DIAGNOSIS — Z888 Allergy status to other drugs, medicaments and biological substances status: Secondary | ICD-10-CM | POA: Insufficient documentation

## 2020-09-18 DIAGNOSIS — K219 Gastro-esophageal reflux disease without esophagitis: Secondary | ICD-10-CM | POA: Insufficient documentation

## 2020-09-18 DIAGNOSIS — F17210 Nicotine dependence, cigarettes, uncomplicated: Secondary | ICD-10-CM | POA: Insufficient documentation

## 2020-09-18 DIAGNOSIS — K2950 Unspecified chronic gastritis without bleeding: Secondary | ICD-10-CM | POA: Insufficient documentation

## 2020-09-18 DIAGNOSIS — K648 Other hemorrhoids: Secondary | ICD-10-CM | POA: Insufficient documentation

## 2020-09-18 HISTORY — PX: ESOPHAGOGASTRODUODENOSCOPY (EGD) WITH PROPOFOL: SHX5813

## 2020-09-18 HISTORY — PX: POLYPECTOMY: SHX149

## 2020-09-18 HISTORY — DX: Myasthenia gravis without (acute) exacerbation: G70.00

## 2020-09-18 HISTORY — PX: BIOPSY: SHX5522

## 2020-09-18 HISTORY — PX: COLONOSCOPY WITH PROPOFOL: SHX5780

## 2020-09-18 LAB — HM COLONOSCOPY

## 2020-09-18 SURGERY — COLONOSCOPY WITH PROPOFOL
Anesthesia: General

## 2020-09-18 MED ORDER — PROPOFOL 500 MG/50ML IV EMUL
INTRAVENOUS | Status: DC | PRN
  Administered 2020-09-18: 150 ug/kg/min via INTRAVENOUS

## 2020-09-18 MED ORDER — PROPOFOL 10 MG/ML IV BOLUS
INTRAVENOUS | Status: DC | PRN
  Administered 2020-09-18: 30 mg via INTRAVENOUS

## 2020-09-18 MED ORDER — LACTATED RINGERS IV SOLN: INTRAVENOUS | Status: DC

## 2020-09-18 MED ORDER — LIDOCAINE HCL (CARDIAC) PF 100 MG/5ML IV SOSY
PREFILLED_SYRINGE | INTRAVENOUS | Status: DC | PRN
  Administered 2020-09-18: 50 mg via INTRATRACHEAL

## 2020-09-18 MED ORDER — KETAMINE HCL 50 MG/5ML IJ SOSY
PREFILLED_SYRINGE | INTRAMUSCULAR | Status: AC
  Filled 2020-09-18: qty 5

## 2020-09-18 MED ORDER — KETAMINE HCL 10 MG/ML IJ SOLN
INTRAMUSCULAR | Status: DC | PRN
  Administered 2020-09-18: 15 mg via INTRAVENOUS

## 2020-09-18 MED ORDER — GLYCOPYRROLATE 0.2 MG/ML IJ SOLN
INTRAMUSCULAR | Status: DC | PRN
  Administered 2020-09-18: .1 mg via INTRAVENOUS

## 2020-09-18 MED ORDER — PHENYLEPHRINE 40 MCG/ML (10ML) SYRINGE FOR IV PUSH (FOR BLOOD PRESSURE SUPPORT)
PREFILLED_SYRINGE | INTRAVENOUS | Status: DC | PRN
  Administered 2020-09-18: 40 ug via INTRAVENOUS

## 2020-09-18 MED ORDER — STERILE WATER FOR IRRIGATION IR SOLN
Status: DC | PRN
  Administered 2020-09-18: 200 mL

## 2020-09-18 NOTE — Op Note (Signed)
[facility] Patient Name: Brooke Kerr Procedure Date: 09/18/2020 8:43 AM MRN: [id number] Date of Birth: 1965-09-01 Attending MD: Maylon Peppers ,  CSN: [id number] Age: 55 Admit Type: Outpatient Procedure:                Colonoscopy Indications:              Screening for colorectal malignant neoplasm Providers:                Maylon Peppers, Crystal Page, Raphael Gibney,                            Technician Referring MD:              Medicines:                Monitored Anesthesia Care Complications:            No immediate complications. Estimated Blood Loss:     Estimated blood loss: none. Procedure:                Pre-Anesthesia Assessment:                           - Prior to the procedure, a History and Physical                            was performed, and patient medications, allergies                            and sensitivities were reviewed. The patient's                            tolerance of previous anesthesia was reviewed.                           - The risks and benefits of the procedure and the                            sedation options and risks were discussed with the                            patient. All questions were answered and informed                            consent was obtained.                           - ASA Grade Assessment: II - A patient with mild                            systemic disease.                           After obtaining informed consent, the colonoscope                            was passed under direct vision. Throughout the  procedure, the patient's blood pressure, pulse, and                            oxygen saturations were monitored continuously. The                            [id number]-[id number] ([id number]) was introduced through the                            anus and advanced to the the cecum, identified by                            appendiceal orifice and ileocecal valve. The                             colonoscopy was performed without difficulty. The                            patient tolerated the procedure well. Scope                            withdrawal time was 14 minutes. The quality of the                            bowel preparation was good. Scope In: 4:54:09 AM Scope Out: 9:15:34 AM Scope Withdrawal Time: 0 hours 18 minutes 31 seconds  Total Procedure Duration: 0 hours 28 minutes 45 seconds  Findings:      Hemorrhoids were found on perianal exam.      Two sessile polyps were found in the ascending colon. The polyps were 4       to 8 mm in size. These polyps were removed with a cold snare. Resection       and retrieval were complete.      A 4 mm polyp was found in the transverse colon. The polyp was sessile.       The polyp was removed with a cold snare. Resection and retrieval were       complete.      Non-bleeding external internal hemorrhoids were found during       retroflexion. The hemorrhoids were medium-sized. Impression:               - Hemorrhoids found on perianal exam.                           - Two 4 to 8 mm polyps in the ascending colon,                            removed with a cold snare. Resected and retrieved.                           - One 4 mm polyp in the transverse colon, removed                            with a cold snare. Resected and retrieved.                           -  Non-bleeding external internal hemorrhoids. Moderate Sedation:      Per Anesthesia Care Recommendation:           - Discharge patient to home (ambulatory).                           - Await pathology results.                           - Repeat colonoscopy for surveillance based on                            pathology results.                           - Start a low FODMAP diet Procedure Code(s):        --- Professional ---                           [phone], Colonoscopy, flexible; with removal of                            tumor(s), polyp(s), or other lesion(s) by snare                             technique Diagnosis Code(s):        --- Professional ---                           K63.5, Polyp of colon                           Z12.11, Encounter for screening for malignant                            neoplasm of colon                           K64.4, Residual hemorrhoidal skin tags                           K64.8, Other hemorrhoids CPT copyright 2019 American Medical Association. All rights reserved. The codes documented in this report are preliminary and upon coder review may  be revised to meet current compliance requirements. Maylon Peppers, MD Maylon Peppers,  09/18/2020 9:20:26 AM This report has been signed electronically. Number of Addenda: 0

## 2020-09-18 NOTE — Anesthesia Postprocedure Evaluation (Signed)
Anesthesia Post Note  Patient: Brooke Kerr  Procedure(s) Performed: COLONOSCOPY WITH PROPOFOL (N/A ) ESOPHAGOGASTRODUODENOSCOPY (EGD) WITH PROPOFOL (N/A ) BIOPSY POLYPECTOMY INTESTINAL  Patient location during evaluation: Phase II Anesthesia Type: General Level of consciousness: awake and alert and oriented Pain management: pain level controlled Vital Signs Assessment: post-procedure vital signs reviewed and stable Respiratory status: spontaneous breathing, nonlabored ventilation and respiratory function stable Cardiovascular status: stable Postop Assessment: no apparent nausea or vomiting Anesthetic complications: no   No complications documented.   Last Vitals:  Vitals:   09/18/20 0715 09/18/20 0923  BP: 116/69 107/75  Pulse:  64  Resp: 16 12  Temp: 36.7 C 36.4 C  SpO2: 100% 98%    Last Pain:  Vitals:   09/18/20 0923  TempSrc: Oral  PainSc: 0-No pain                 Traveion Ruddock Hristova

## 2020-09-18 NOTE — Op Note (Signed)
[facility] Patient Name: Brooke Kerr Procedure Date: 09/18/2020 8:11 AM MRN: [id number] Date of Birth: 01/30/1966 Attending MD: Maylon Peppers ,  CSN: [id number] Age: 55 Admit Type: Outpatient Procedure:                Upper GI endoscopy Indications:              Gastro-esophageal reflux disease, Diarrhea Providers:                Maylon Peppers, Crystal Page, Raphael Gibney,                            Technician Referring MD:              Medicines:                Monitored Anesthesia Care Complications:            No immediate complications. Estimated Blood Loss:     Estimated blood loss: none. Procedure:                Pre-Anesthesia Assessment:                           - Prior to the procedure, a History and Physical                            was performed, and patient medications, allergies                            and sensitivities were reviewed. The patient's                            tolerance of previous anesthesia was reviewed.                           - The risks and benefits of the procedure and the                            sedation options and risks were discussed with the                            patient. All questions were answered and informed                            consent was obtained.                           - ASA Grade Assessment: II - A patient with mild                            systemic disease.                           After obtaining informed consent, the endoscope was                            passed under direct vision. Throughout the  procedure, the patient's blood pressure, pulse, and                            oxygen saturations were monitored continuously. The                            [id number]-[id number] ([id number]) was introduced through the                            mouth, and advanced to the second part of duodenum.                            The upper GI endoscopy was accomplished without                             difficulty. The patient tolerated the procedure                            well. Scope In: 8:31:18 AM Scope Out: 8:40:11 AM Total Procedure Duration: 0 hours 8 minutes 53 seconds  Findings:      The examined esophagus was normal. Imaging was performed using white       light and narrow band imaging to visualize the mucosa, especifically the       GE junction, which was normal.      Two localized 4 mm erosions with no stigmata of recent bleeding were       found in the gastric antrum. Biopsies were taken with a cold forceps for       Helicobacter pylori testing.      The examined duodenum was normal. Biopsies for histology were taken with       a cold forceps for evaluation of celiac disease. Impression:               - Normal esophagus.                           - Erosive gastropathy with no stigmata of recent                            bleeding. Biopsied.                           - Normal examined duodenum. Biopsied. Moderate Sedation:      Per Anesthesia Care Recommendation:           - Discharge patient to home (ambulatory).                           - Resume previous diet.                           - Await pathology results.                           - Continue present medications including omeprazole  20 mg qday. Procedure Code(s):        --- Professional ---                           [phone], Esophagogastroduodenoscopy, flexible,                            transoral; with biopsy, single or multiple Diagnosis Code(s):        --- Professional ---                           K31.89, Other diseases of stomach and duodenum                           K21.9, Gastro-esophageal reflux disease without                            esophagitis                           R19.7, Diarrhea, unspecified CPT copyright 2019 American Medical Association. All rights reserved. The codes documented in this report are preliminary and upon coder review may  be revised to  meet current compliance requirements. Maylon Peppers, MD Maylon Peppers,  09/18/2020 8:44:51 AM This report has been signed electronically. Number of Addenda: 0

## 2020-09-18 NOTE — Discharge Instructions (Signed)
You are being discharged to home.  Start a low FODMAP diet  Your physician has recommended a repeat colonoscopy for surveillance based on pathology results.  We are waiting for your pathology results.  Continue your present medications including omeprazole 20 mg qday.    Colonoscopy, Adult, Care After This sheet gives you information about how to care for yourself after your procedure. Your doctor may also give you more specific instructions. If you have problems or questions, call your doctor. What can I expect after the procedure? After the procedure, it is common to have:  A small amount of blood in your poop (stool) for 24 hours.  Some gas.  Mild cramping or bloating in your belly (abdomen). Follow these instructions at home: Eating and drinking  Drink enough fluid to keep your pee (urine) pale yellow.  Follow instructions from your doctor about what you cannot eat or drink.  Return to your normal diet as told by your doctor. Avoid heavy or fried foods that are hard to digest.   Activity  Rest as told by your doctor.  Do not sit for a long time without moving. Get up to take short walks every 1-2 hours. This is important. Ask for help if you feel weak or unsteady.  Return to your normal activities as told by your doctor. Ask your doctor what activities are safe for you. To help cramping and bloating:  Try walking around.  Put heat on your belly as told by your doctor. Use the heat source that your doctor recommends, such as a moist heat pack or a heating pad. ? Put a towel between your skin and the heat source. ? Leave the heat on for 20-30 minutes. ? Remove the heat if your skin turns bright red. This is very important if you are unable to feel pain, heat, or cold. You may have a greater risk of getting burned.   General instructions  If you were given a medicine to help you relax (sedative) during your procedure, it can affect you for many hours. Do not drive or use  machinery until your doctor says that it is safe.  For the first 24 hours after the procedure: ? Do not sign important documents. ? Do not drink alcohol. ? Do your daily activities more slowly than normal. ? Eat foods that are soft and easy to digest.  Take over-the-counter or prescription medicines only as told by your doctor.  Keep all follow-up visits as told by your doctor. This is important. Contact a doctor if:  You have blood in your poop 2-3 days after the procedure. Get help right away if:  You have more than a small amount of blood in your poop.  You see large clumps of tissue (blood clots) in your poop.  Your belly is swollen.  You feel like you may vomit (nauseous).  You vomit.  You have a fever.  You have belly pain that gets worse, and medicine does not help your pain. Summary  After the procedure, it is common to have a small amount of blood in your poop. You may also have mild cramping and bloating in your belly.  If you were given a medicine to help you relax (sedative) during your procedure, it can affect you for many hours. Do not drive or use machinery until your doctor says that it is safe.  Get help right away if you have a lot of blood in your poop, feel like you may vomit, have  a fever, or have more belly pain. This information is not intended to replace advice given to you by your health care provider. Make sure you discuss any questions you have with your health care provider. Document Revised: 04/14/2019 Document Reviewed: 01/02/2019 Elsevier Patient Education  2021 [place].    Upper Endoscopy, Adult, Care After This sheet gives you information about how to care for yourself after your procedure. Your health care provider may also give you more specific instructions. If you have problems or questions, contact your health care provider. What can I expect after the procedure? After the procedure, it is common to have:  A sore  throat.  Mild stomach pain or discomfort.  Bloating.  Nausea. Follow these instructions at home:  Follow instructions from your health care provider about what to eat or drink after your procedure.  Return to your normal activities as told by your health care provider. Ask your health care provider what activities are safe for you.  Take over-the-counter and prescription medicines only as told by your health care provider.  If you were given a sedative during the procedure, it can affect you for several hours. Do not drive or operate machinery until your health care provider says that it is safe.  Keep all follow-up visits as told by your health care provider. This is important.   Contact a health care provider if you have:  A sore throat that lasts longer than one day.  Trouble swallowing. Get help right away if:  You vomit blood or your vomit looks like coffee grounds.  You have: ? A fever. ? Bloody, black, or tarry stools. ? A severe sore throat or you cannot swallow. ? Difficulty breathing. ? Severe pain in your chest or abdomen. Summary  After the procedure, it is common to have a sore throat, mild stomach discomfort, bloating, and nausea.  If you were given a sedative during the procedure, it can affect you for several hours. Do not drive or operate machinery until your health care provider says that it is safe.  Follow instructions from your health care provider about what to eat or drink after your procedure.  Return to your normal activities as told by your health care provider. This information is not intended to replace advice given to you by your health care provider. Make sure you discuss any questions you have with your health care provider. Document Revised: 06/06/2019 Document Reviewed: 11/08/2017 Elsevier Patient Education  2021 [place] stands for fermentable oligosaccharides, disaccharides, monosaccharides, and  polyols. These are sugars that are hard for some people to digest. A low-FODMAP eating plan may help some people who have irritable bowel syndrome (IBS) and certain other bowel (intestinal) diseases to manage their symptoms. This meal plan can be complicated to follow. Work with a diet and nutrition specialist (dietitian) to make a low-FODMAP eating plan that is right for you. A dietitian can help make sure that you get enough nutrition from this diet. What are tips for following this plan? Reading food labels  Check labels for hidden FODMAPs such as: ? High-fructose syrup. ? Honey. ? Agave. ? Natural fruit flavors. ? Onion or garlic powder.  Choose low-FODMAP foods that contain 3-4 grams of fiber per serving.  Check food labels for serving sizes. Eat only one serving at a time to make sure FODMAP levels stay low. Shopping  Shop with a list of foods that are recommended on this diet and  make a meal plan. Meal planning  Follow a low-FODMAP eating plan for up to 6 weeks, or as told by your health care provider or dietitian.  To follow the eating plan: 1. Eliminate high-FODMAP foods from your diet completely. Choose only low-FODMAP foods to eat. You will do this for 2-6 weeks. 2. Gradually reintroduce high-FODMAP foods into your diet one at a time. Most people should wait a few days before introducing the next new high-FODMAP food into their meal plan. Your dietitian can recommend how quickly you may reintroduce foods. 3. Keep a daily record of what and how much you eat and drink. Make note of any symptoms that you have after eating. 4. Review your daily record with a dietitian regularly to identify which foods you can eat and which foods you should avoid. General tips  Drink enough fluid each day to keep your urine pale yellow.  Avoid processed foods. These often have added sugar and may be high in FODMAPs.  Avoid most dairy products, whole grains, and sweeteners.  Work with a  dietitian to make sure you get enough fiber in your diet.  Avoid high FODMAP foods at meals to manage symptoms. Recommended foods Fruits Bananas, oranges, tangerines, lemons, limes, blueberries, raspberries, strawberries, grapes, cantaloupe, honeydew melon, kiwi, papaya, passion fruit, and pineapple. Limited amounts of dried cranberries, banana chips, and shredded coconut. Vegetables Eggplant, zucchini, cucumber, peppers, green beans, bean sprouts, lettuce, arugula, kale, Swiss chard, spinach, collard greens, bok choy, summer squash, potato, and tomato. Limited amounts of corn, carrot, and sweet potato. Green parts of scallions. Grains Gluten-free grains, such as rice, oats, buckwheat, quinoa, corn, polenta, and millet. Gluten-free pasta, bread, or cereal. Rice noodles. Corn tortillas. Meats and other proteins Unseasoned beef, pork, poultry, or fish. Eggs. Berniece Salines. Tofu (firm) and tempeh. Limited amounts of nuts and seeds, such as almonds, walnuts, [place] nuts, pecans, peanuts, nut butters, pumpkin seeds, chia seeds, and sunflower seeds. Dairy Lactose-free milk, yogurt, and kefir. Lactose-free cottage cheese and ice cream. Non-dairy milks, such as almond, coconut, hemp, and rice milk. Non-dairy yogurt. Limited amounts of goat cheese, brie, mozzarella, parmesan, swiss, and other hard cheeses. Fats and oils Butter-free spreads. Vegetable oils, such as olive, canola, and sunflower oil. Seasoning and other foods Artificial sweeteners with names that do not end in "ol," such as aspartame, saccharine, and stevia. Maple syrup, white table sugar, raw sugar, brown sugar, and molasses. Mayonnaise, soy sauce, and tamari. Fresh basil, coriander, parsley, rosemary, and thyme. Beverages Water and mineral water. Sugar-sweetened soft drinks. Small amounts of orange juice or cranberry juice. Black and green tea. Most dry wines. Coffee. The items listed above may not be a complete list of foods and beverages you can  eat. Contact a dietitian for more information. Foods to avoid Fruits Fresh, dried, and juiced forms of apple, pear, watermelon, peach, plum, cherries, apricots, blackberries, boysenberries, figs, nectarines, and mango. Avocado. Vegetables Chicory root, artichoke, asparagus, cabbage, snow peas, Brussels sprouts, broccoli, sugar snap peas, mushrooms, celery, and cauliflower. Onions, garlic, leeks, and the white part of scallions. Grains Wheat, including kamut, durum, and semolina. Barley and bulgur. Couscous. Wheat-based cereals. Wheat noodles, bread, crackers, and pastries. Meats and other proteins Fried or fatty meat. Sausage. Cashews and pistachios. Soybeans, baked beans, black beans, chickpeas, kidney beans, fava beans, navy beans, lentils, black-eyed peas, and split peas. Dairy Milk, yogurt, ice cream, and soft cheese. Cream and sour cream. Milk-based sauces. Custard. Buttermilk. Soy milk. Seasoning and other foods Any sugar-free gum or  candy. Foods that contain artificial sweeteners such as sorbitol, mannitol, isomalt, or xylitol. Foods that contain honey, high-fructose corn syrup, or agave. Bouillon, vegetable stock, beef stock, and chicken stock. Garlic and onion powder. Condiments made with onion, such as hummus, chutney, pickles, relish, salad dressing, and salsa. Tomato paste. Beverages Chicory-based drinks. Coffee substitutes. Chamomile tea. Fennel tea. Sweet or fortified wines such as port or sherry. Diet soft drinks made with isomalt, mannitol, maltitol, sorbitol, or xylitol. Apple, pear, and mango juice. Juices with high-fructose corn syrup. The items listed above may not be a complete list of foods and beverages you should avoid. Contact a dietitian for more information. Summary  FODMAP stands for fermentable oligosaccharides, disaccharides, monosaccharides, and polyols. These are sugars that are hard for some people to digest.  A low-FODMAP eating plan is a short-term diet that  helps to ease symptoms of certain bowel diseases.  The eating plan usually lasts up to 6 weeks. After that, high-FODMAP foods are reintroduced gradually and one at a time. This can help you find out which foods may be causing symptoms.  A low-FODMAP eating plan can be complicated. It is best to work with a dietitian who has experience with this type of plan. This information is not intended to replace advice given to you by your health care provider. Make sure you discuss any questions you have with your health care provider. Document Revised: 10/26/2019 Document Reviewed: 10/26/2019 Elsevier Patient Education  [place].  Colon Polyps  Colon polyps are tissue growths inside the colon, which is part of the large intestine. They are one of the types of polyps that can grow in the body. A polyp may be a round bump or a mushroom-shaped growth. You could have one polyp or more than one. Most colon polyps are noncancerous (benign). However, some colon polyps can become cancerous over time. Finding and removing the polyps early can help prevent this. What are the causes? The exact cause of colon polyps is not known. What increases the risk? The following factors may make you more likely to develop this condition:  Having a family history of colorectal cancer or colon polyps.  Being older than 54 years of age.  Being younger than 54 years of age and having a significant family history of colorectal cancer or colon polyps or a genetic condition that puts you at higher risk of getting colon polyps.  Having inflammatory bowel disease, such as ulcerative colitis or Crohn's disease.  Having certain conditions passed from parent to child (hereditary conditions), such as: ? Familial adenomatous polyposis (FAP). ? Lynch syndrome. ? Turcot syndrome. ? Peutz-Jeghers syndrome. ? MUTYH-associated polyposis (MAP).  Being overweight.  Certain lifestyle factors. These include smoking  cigarettes, drinking too much alcohol, not getting enough exercise, and eating a diet that is high in fat and red meat and low in fiber.  Having had childhood cancer that was treated with radiation of the abdomen. What are the signs or symptoms? Many times, there are no symptoms. If you have symptoms, they may include:  Blood coming from the rectum during a bowel movement.  Blood in the stool (feces). The blood may be bright red or very dark in color.  Pain in the abdomen.  A change in bowel habits, such as constipation or diarrhea. How is this diagnosed? This condition is diagnosed with a colonoscopy. This is a procedure in which a lighted, flexible scope is inserted into the opening between the buttocks (anus) and then passed  into the colon to examine the area. Polyps are sometimes found when a colonoscopy is done as part of routine cancer screening tests. How is this treated? This condition is treated by removing any polyps that are found. Most polyps can be removed during a colonoscopy. Those polyps will then be tested for cancer. Additional treatment may be needed depending on the results of testing. Follow these instructions at home: Eating and drinking  Eat foods that are high in fiber, such as fruits, vegetables, and whole grains.  Eat foods that are high in calcium and vitamin D, such as milk, cheese, yogurt, eggs, liver, fish, and broccoli.  Limit foods that are high in fat, such as fried foods and desserts.  Limit the amount of red meat, precooked or cured meat, or other processed meat that you eat, such as hot dogs, sausages, bacon, or meat loaves.  Limit sugary drinks.   Lifestyle  Maintain a healthy weight, or lose weight if recommended by your health care provider.  Exercise every day or as told by your health care provider.  Do not use any products that contain nicotine or tobacco, such as cigarettes, e-cigarettes, and chewing tobacco. If you need help quitting,  ask your health care provider.  Do not drink alcohol if: ? Your health care provider tells you not to drink. ? You are pregnant, may be pregnant, or are planning to become pregnant.  If you drink alcohol: ? Limit how much you use to:  0-1 drink a day for women.  0-2 drinks a day for men. ? Know how much alcohol is in your drink. In the [place] one drink equals one 12 oz bottle of beer (355 mL), one 5 oz glass of wine (148 mL), or one 1 oz glass of hard liquor (44 mL). General instructions  Take over-the-counter and prescription medicines only as told by your health care provider.  Keep all follow-up visits. This is important. This includes having regularly scheduled colonoscopies. Talk to your health care provider about when you need a colonoscopy. Contact a health care provider if:  You have new or worsening bleeding during a bowel movement.  You have new or increased blood in your stool.  You have a change in bowel habits.  You lose weight for no known reason. Summary  Colon polyps are tissue growths inside the colon, which is part of the large intestine. They are one type of polyp that can grow in the body.  Most colon polyps are noncancerous (benign), but some can become cancerous over time.  This condition is diagnosed with a colonoscopy.  This condition is treated by removing any polyps that are found. Most polyps can be removed during a colonoscopy. This information is not intended to replace advice given to you by your health care provider. Make sure you discuss any questions you have with your health care provider. Document Revised: 09/27/2019 Document Reviewed: 09/27/2019 Elsevier Patient Education  2021 [employer].

## 2020-09-18 NOTE — Interval H&P Note (Signed)
History and Physical Interval Note:  09/18/2020 7:27 AM Brooke Kerr is a 55 y.o. 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  Patient states feeling well. States still feeling bloating and intermittent episodes of diarrhea and constipation, which have fluctuated in frequency. Has not implemented dietary changes yet. Has been on Prilosec 20 mg qday with adequate control of her heartburn.   Last EGD:> 10 years ago, normal per patient (no reports available) Last Colonoscopy: 20  Years ago, had it performed for melena per patient  (no reports available)  BP 116/69   Temp 98 F (36.7 C) (Oral)   Resp 16   Ht 5' 7.5" (1.715 m)   Wt 52.2 kg   SpO2 100%   BMI 17.75 kg/m  GENERAL: The patient is AO x3, in no acute distress. HEENT: Head is normocephalic and atraumatic. EOMI are intact. Mouth is well hydrated and without lesions. NECK: Supple. No masses LUNGS: Clear to auscultation. No presence of rhonchi/wheezing/rales. Adequate chest expansion HEART: RRR, normal s1 and s2. ABDOMEN: Soft, nontender, no guarding, no peritoneal signs, and nondistended. BS +. No masses. EXTREMITIES: Without any cyanosis, clubbing, rash, lesions or edema. NEUROLOGIC: AOx3, no focal motor deficit. SKIN: no jaundice, no rashes  Brooke Kerr  has presented today for surgery, with the diagnosis of Abdominal Pain GERD Screening colonoscopy.  The various methods of treatment have been discussed with the patient and family. After consideration of risks, benefits and other options for treatment, the patient has consented to  Procedure(s) with comments: COLONOSCOPY WITH PROPOFOL (N/A) - AM ESOPHAGOGASTRODUODENOSCOPY (EGD) WITH PROPOFOL (N/A) as a surgical intervention.  The patient's history has been reviewed, patient examined, no change in status, stable for  surgery.  I have reviewed the patient's chart and labs.  Questions were answered to the patient's satisfaction.     [name]

## 2020-09-18 NOTE — Transfer of Care (Signed)
Immediate Anesthesia Transfer of Care Note  Patient: Syrina Wake. Spradley  Procedure(s) Performed: COLONOSCOPY WITH PROPOFOL (N/A ) ESOPHAGOGASTRODUODENOSCOPY (EGD) WITH PROPOFOL (N/A ) BIOPSY POLYPECTOMY INTESTINAL  Patient Location: PACU  Anesthesia Type:General  Level of Consciousness: awake  Airway & Oxygen Therapy: Patient Spontanous Breathing  Post-op Assessment: Report given to RN and Post -op Vital signs reviewed and stable  Post vital signs: Reviewed and stable  Last Vitals:  Vitals Value Taken Time  BP 107/75 09/18/20 0923  Temp 36.4 C 09/18/20 0923  Pulse 64 09/18/20 0923  Resp 12 09/18/20 0923  SpO2 98 % 09/18/20 0923    Last Pain:  Vitals:   09/18/20 0923  TempSrc: Oral  PainSc: 0-No pain      Patients Stated Pain Goal: 7 ([id number])  Complications: No complications documented.

## 2020-09-18 NOTE — Anesthesia Preprocedure Evaluation (Addendum)
Anesthesia Evaluation  Patient identified by MRN, date of birth, ID band Patient awake    Reviewed: Allergy & Precautions, NPO status , Patient's Chart, lab work & pertinent test results  History of Anesthesia Complications Negative for: history of anesthetic complications  Airway Mallampati: II  TM Distance: >3 FB Neck ROM: Full    Dental  (+) Dental Advisory Given [place]:   Pulmonary Current SmokerPatient did not abstain from smoking.,    Pulmonary exam normal breath sounds clear to auscultation       Cardiovascular Exercise Tolerance: Good Normal cardiovascular exam Rhythm:Regular Rate:Normal     Neuro/Psych  Headaches, PSYCHIATRIC DISORDERS Anxiety Depression  Neuromuscular disease (myasthenia gravis)    GI/Hepatic Neg liver ROS, GERD  Medicated and Controlled,  Endo/Other  Hypothyroidism   Renal/GU negative Renal ROS     Musculoskeletal negative musculoskeletal ROS (+)   Abdominal   Peds  Hematology negative hematology ROS (+)   Anesthesia Other Findings Taking prednisone  Reproductive/Obstetrics negative OB ROS                            Anesthesia Physical Anesthesia Plan  ASA: II  Anesthesia Plan: General   Post-op Pain Management:    Induction: Intravenous  PONV Risk Score and Plan: Propofol infusion  Airway Management Planned: Nasal Cannula and Natural Airway  Additional Equipment:   Intra-op Plan:   Post-operative Plan:   Informed Consent: I have reviewed the patients History and Physical, chart, labs and discussed the procedure including the risks, benefits and alternatives for the proposed anesthesia with the patient or authorized representative who has indicated his/her understanding and acceptance.     Dental advisory given  Plan Discussed with: CRNA and Surgeon  Anesthesia Plan Comments:       Anesthesia Quick Evaluation

## 2020-09-19 ENCOUNTER — Encounter (INDEPENDENT_AMBULATORY_CARE_PROVIDER_SITE_OTHER): Payer: Self-pay

## 2020-09-19 LAB — SURGICAL PATHOLOGY

## 2020-09-20 ENCOUNTER — Encounter (INDEPENDENT_AMBULATORY_CARE_PROVIDER_SITE_OTHER): Payer: Self-pay | Admitting: *Deleted

## 2020-09-23 ENCOUNTER — Encounter (HOSPITAL_COMMUNITY): Payer: Self-pay | Admitting: Gastroenterology

## 2021-03-10 ENCOUNTER — Ambulatory Visit (INDEPENDENT_AMBULATORY_CARE_PROVIDER_SITE_OTHER): Payer: BC Managed Care – PPO | Admitting: Gastroenterology

## 2021-03-17 ENCOUNTER — Ambulatory Visit (INDEPENDENT_AMBULATORY_CARE_PROVIDER_SITE_OTHER): Payer: BC Managed Care – PPO | Admitting: Gastroenterology

## 2021-09-29 ENCOUNTER — Other Ambulatory Visit (INDEPENDENT_AMBULATORY_CARE_PROVIDER_SITE_OTHER): Payer: Self-pay | Admitting: Gastroenterology

## 2021-09-29 DIAGNOSIS — K219 Gastro-esophageal reflux disease without esophagitis: Secondary | ICD-10-CM

## 2022-04-16 ENCOUNTER — Encounter (INDEPENDENT_AMBULATORY_CARE_PROVIDER_SITE_OTHER): Payer: Self-pay | Admitting: Gastroenterology

## 2022-04-16 ENCOUNTER — Ambulatory Visit (INDEPENDENT_AMBULATORY_CARE_PROVIDER_SITE_OTHER): Payer: BC Managed Care – PPO | Admitting: Gastroenterology

## 2022-04-16 ENCOUNTER — Encounter (INDEPENDENT_AMBULATORY_CARE_PROVIDER_SITE_OTHER): Payer: Self-pay

## 2022-04-16 VITALS — BP 120/76 | HR 80 | Temp 97.9°F | Ht 67.0 in | Wt 110.1 lb

## 2022-04-16 DIAGNOSIS — K582 Mixed irritable bowel syndrome: Secondary | ICD-10-CM | POA: Diagnosis not present

## 2022-04-16 DIAGNOSIS — R109 Unspecified abdominal pain: Secondary | ICD-10-CM | POA: Diagnosis not present

## 2022-04-16 DIAGNOSIS — K219 Gastro-esophageal reflux disease without esophagitis: Secondary | ICD-10-CM

## 2022-04-16 MED ORDER — DICYCLOMINE HCL 10 MG PO CAPS: 10.0000 mg | ORAL_CAPSULE | Freq: Two times a day (BID) | ORAL | 1 refills | Status: DC | PRN

## 2022-04-16 NOTE — Progress Notes (Signed)
Maylon Peppers, M.D. Gastroenterology & Hepatology [facility] [address] [place],  [address]  Primary Care Physician: Kennieth Rad, MD [facility] [address] [place] [address]  I will communicate my assessment and recommendations to the referring MD via EMR.  Problems: GERD IBS-C  History of Present Illness: Brooke Kerr is a 56 y.o. female with PMH MG on prednisone, hypothyroidism due to radiation therapy (had Graves disease), GERD, anxiety and depression, who presents for follow-up of abdominal pain and GERD.  The patient was last seen on 09/02/2020. At that time, the patient was scheduled for an EGD and colonoscopy.  She was advised to follow a low FODMAP diet.  Celiac serologies were checked which were negative.  She was started on Nexium 10 mg daily.  Was also started on MiraLAX.  Patient reports that close to 3 weeks ago she was having a meal with her husband and had acute onset of lower abdominal pain and epigastric pain when she started eating, as well as nausea with some vomiting episodes. She took some ibuprofen, which led to relief of her symptoms next day. She reported feeling well afterwards but had a second episode of abdominal pain a week ago which subsided on its own.  She is concerned as she does not know what has led to her abdominal pain episodes.  She states that she is having a bowel movement every time she has a meal, and has some fecal urgency with some tenesmus. This pattern has been present for the last years, but she used to be very constipated. She is not following any diet - has cut down dairy in the past, but it did not improve her symptoms.  The patient is still taking Nexium 20 mg qday which controls her heartburn. No dysphagia or odynophagia.  The patient denies having any nausea, vomiting, fever, chills, hematochezia, melena, hematemesis, diarrhea, jaundice, pruritus or weight  loss.  Last EGD: 09/18/2020 - Normal esophagus. - Erosive gastropathy with no stigmata of recent bleeding. Biopsied. - Normal examined duodenum. Biopsied.  Last Colonoscopy: 09/18/2020 - Hemorrhoids found on perianal exam. - Two 4 to 8 mm polyps in the ascending colon, removed with a cold snare. Resected and retrieved. - One 4 mm polyp in the transverse colon, removed with a cold snare. Resected and retrieved. - Non-bleeding external internal hemorrhoids.  Path: A. SMALL BOWEL, BIOPSY:  - Small intestinal mucosa with no specific histopathologic changes  - Negative for increased intraepithelial lymphocytes or villous  architectural changes   B. STOMACH, BIOPSY:  - Gastric antral and oxyntic mucosa with mild chronic gastritis  - Warthin Starry stain is negative for Helicobacter pylori   C. COLON, CECAL, POLYPECTOMY:  - Tubular adenoma(s)  - Negative for high-grade dysplasia or malignancy   D. COLON, TRANSVERSE, POLYPECTOMY:  - Polypoid colonic mucosa with mild hyperplastic changes  - Negative for dysplasia  Recommended repeat colonoscopy in 7 years  Past Medical History: Past Medical History:  Diagnosis Date   Anxiety    Depression    Headache(784.0)    Myasthenia gravis ([place])    UTI (lower urinary tract infection)     Past Surgical History: Past Surgical History:  Procedure Laterality Date   ABDOMINAL HYSTERECTOMY     BIOPSY  09/18/2020   Procedure: BIOPSY;  Surgeon: Harvel Quale, MD;  Location: [facility];  Service: Gastroenterology;;   COLONOSCOPY WITH PROPOFOL N/A 09/18/2020   Procedure: COLONOSCOPY WITH PROPOFOL;  Surgeon:  Harvel Quale, MD;  Location: [facility];  Service: Gastroenterology;  Laterality: N/A;  AM   ESOPHAGOGASTRODUODENOSCOPY (EGD) WITH PROPOFOL N/A 09/18/2020   Procedure: ESOPHAGOGASTRODUODENOSCOPY (EGD) WITH PROPOFOL;  Surgeon: Harvel Quale, MD;  Location: [facility];  Service: Gastroenterology;   Laterality: N/A;   POLYPECTOMY  09/18/2020   Procedure: POLYPECTOMY INTESTINAL;  Surgeon: Montez Morita, Quillian Quince, MD;  Location: [facility];  Service: Gastroenterology;;    Family History: Family History  Problem Relation Age of Onset   Dementia Maternal Grandfather    Depression Maternal Grandmother    Seizures Son    ADD / ADHD Neg Hx    Alcohol abuse Neg Hx    Drug abuse Neg Hx    Bipolar disorder Neg Hx    Anxiety disorder Neg Hx    OCD Neg Hx    Paranoid behavior Neg Hx    Schizophrenia Neg Hx    Sexual abuse Neg Hx    Physical abuse Neg Hx     Social History: Social History   Tobacco Use  Smoking Status Every Day   Packs/day: 0.50   Types: Cigarettes  Smokeless Tobacco Never  Tobacco Comments   3-6 cigs a day as of 12/13/2012   Social History   Substance and Sexual Activity  Alcohol Use Yes   Comment: occasionally   Social History   Substance and Sexual Activity  Drug Use No    Allergies: Allergies  Allergen Reactions   Ambien [Zolpidem] Other (See Comments)    "Loopy"    Medications: Current Outpatient Medications  Medication Sig Dispense Refill   esomeprazole (NEXIUM) 20 MG capsule Take 1 capsule (20 mg total) by mouth daily before breakfast. 90 capsule 3   levothyroxine (SYNTHROID) 75 MCG tablet Take 75 mcg by mouth daily before breakfast.     venlafaxine XR (EFFEXOR-XR) 150 MG 24 hr capsule Take 150 mg by mouth daily with breakfast.     No current facility-administered medications for this visit.    Review of Systems: GENERAL: negative for malaise, night sweats HEENT: No changes in hearing or vision, no nose bleeds or other nasal problems. NECK: Negative for lumps, goiter, pain and significant neck swelling RESPIRATORY: Negative for cough, wheezing CARDIOVASCULAR: Negative for chest pain, leg swelling, palpitations, orthopnea GI: SEE HPI MUSCULOSKELETAL: Negative for joint pain or swelling, back pain, and muscle pain. SKIN: Negative  for lesions, rash PSYCH: Negative for sleep disturbance, mood disorder and recent psychosocial stressors. HEMATOLOGY Negative for prolonged bleeding, bruising easily, and swollen nodes. ENDOCRINE: Negative for cold or heat intolerance, polyuria, polydipsia and goiter. NEURO: negative for tremor, gait imbalance, syncope and seizures. The remainder of the review of systems is noncontributory.   Physical Exam: BP 120/76 (BP Location: Left Arm, Patient Position: Sitting, Cuff Size: Large)   Pulse 80   Temp 97.9 F (36.6 C) (Oral)   Ht '5\' 7"'$  (1.702 m)   Wt 110 lb 1.6 oz (49.9 kg)   BMI 17.24 kg/m  GENERAL: The patient is AO x3, in no acute distress. HEENT: Head is normocephalic and atraumatic. EOMI are intact. Mouth is well hydrated and without lesions. NECK: Supple. No masses LUNGS: Clear to auscultation. No presence of rhonchi/wheezing/rales. Adequate chest expansion HEART: RRR, normal s1 and s2. ABDOMEN: Soft, nontender, no guarding, no peritoneal signs, and nondistended. BS +. No masses. EXTREMITIES: Without any cyanosis, clubbing, rash, lesions or edema. NEUROLOGIC: AOx3, no focal motor deficit. SKIN: no jaundice, no rashes  Imaging/Labs: as above  I personally reviewed and interpreted the available labs, imaging and endoscopic files.  Impression and Plan: Brooke Kerr is a 56 y.o. female with PMH MG on prednisone, hypothyroidism due to radiation therapy (had Graves disease), GERD, anxiety and depression, who presents for follow-up of abdominal pain and GERD.  The patient had new onset of abdominal pain that has been self-limited.  The patient is very concerned about the new onset of her symptoms.  It is possible that part of her symptoms are related to a flare of her irritable bowel syndrome.  I do not consider any endoscopic investigations warranted at this point as she had relatively recent investigations that were negative but we will evaluate this further with cross-sectional  abdominal imaging.  I emphasized to her the importance of following a low FODMAP diet to determine if there is any potential food triggers causing her chronic complaints.  For now, she can take Bentyl as needed for any recurrent episodes of abdominal pain.  Given her chronic history of GERD, we had a discussion about nonpharmacological options including TIF, educational material was provided to the patient.  She will continue on low-dose omeprazole every day for now.  - Schedule CT abdomen/pelvis with IV contrast -Explained presumed etiology of IBS symptoms. Patient was counseled about the benefit of implementing a low FODMAP to improve symptoms and recurrent episodes. A dietary list was provided to the patient. Also, the patient was counseled about the benefit of avoiding stressing situations and potential environmental triggers leading to symptomatology. -Start Bentyl 1 tablet q12h as needed for abdominal pain -The patient and I held a thorough discussion about potential nonpharmacologic treatments for reflux which include Transoral Incisionless Fundoplication (TIF) a.  The benefits and risks, as well as prognosis with the use of the different modalities was thoroughly discussed with the patient who understood and agreed.  The patient will read more about this procedure and will let me know if interested to pursue this in the future. -Continue omeprazole 20 mg every day  All questions were answered.      Maylon Peppers, MD Gastroenterology and Hepatology [facility]

## 2022-04-16 NOTE — Patient Instructions (Signed)
Schedule CT abdomen/pelvis with IV contrast Explained presumed etiology of IBS symptoms. Patient was counseled about the benefit of implementing a low FODMAP to improve symptoms and recurrent episodes. A dietary list was provided to the patient. Also, the patient was counseled about the benefit of avoiding stressing situations and potential environmental triggers leading to symptomatology. Start Bentyl 1 tablet q12h as needed for abdominal pain The patient and I held a thorough discussion about potential nonpharmacologic treatments for reflux which include Transoral Incisionless Fundoplication (TIF) a.  The benefits and risks, as well as prognosis with the use of the different modalities was thoroughly discussed with the patient who understood and agreed.  The patient will read more about this procedure and will let me know if interested to pursue this in the future.

## 2022-05-05 ENCOUNTER — Encounter (HOSPITAL_COMMUNITY): Payer: Self-pay | Admitting: Radiology

## 2022-05-05 ENCOUNTER — Ambulatory Visit (HOSPITAL_COMMUNITY)
Admission: RE | Admit: 2022-05-05 | Discharge: 2022-05-05 | Disposition: A | Discharge status: Home / Self Care | Payer: BC Managed Care – PPO | Source: Ambulatory Visit | Attending: Gastroenterology | Admitting: Gastroenterology

## 2022-05-05 DIAGNOSIS — R109 Unspecified abdominal pain: Secondary | ICD-10-CM | POA: Diagnosis not present

## 2022-05-05 MED ORDER — IOHEXOL 300 MG/ML  SOLN
100.0000 mL | Freq: Once | INTRAMUSCULAR | Status: AC | PRN
  Administered 2022-05-05: 100 mL via INTRAVENOUS

## 2022-05-08 ENCOUNTER — Other Ambulatory Visit (INDEPENDENT_AMBULATORY_CARE_PROVIDER_SITE_OTHER): Payer: Self-pay | Admitting: Gastroenterology

## 2022-05-08 DIAGNOSIS — R109 Unspecified abdominal pain: Secondary | ICD-10-CM

## 2022-05-08 DIAGNOSIS — K582 Mixed irritable bowel syndrome: Secondary | ICD-10-CM

## 2022-05-11 ENCOUNTER — Other Ambulatory Visit (INDEPENDENT_AMBULATORY_CARE_PROVIDER_SITE_OTHER): Payer: Self-pay | Admitting: Gastroenterology

## 2022-05-11 DIAGNOSIS — K529 Noninfective gastroenteritis and colitis, unspecified: Secondary | ICD-10-CM

## 2022-05-22 ENCOUNTER — Other Ambulatory Visit (INDEPENDENT_AMBULATORY_CARE_PROVIDER_SITE_OTHER): Payer: Self-pay | Admitting: Gastroenterology

## 2022-05-22 DIAGNOSIS — K582 Mixed irritable bowel syndrome: Secondary | ICD-10-CM

## 2022-05-22 DIAGNOSIS — R109 Unspecified abdominal pain: Secondary | ICD-10-CM

## 2022-05-22 NOTE — Telephone Encounter (Signed)
Last seen 04/16/22 

## 2022-07-02 ENCOUNTER — Other Ambulatory Visit (INDEPENDENT_AMBULATORY_CARE_PROVIDER_SITE_OTHER): Payer: Self-pay | Admitting: Gastroenterology

## 2022-07-02 DIAGNOSIS — R109 Unspecified abdominal pain: Secondary | ICD-10-CM

## 2022-07-02 DIAGNOSIS — K582 Mixed irritable bowel syndrome: Secondary | ICD-10-CM

## 2022-07-20 ENCOUNTER — Ambulatory Visit (INDEPENDENT_AMBULATORY_CARE_PROVIDER_SITE_OTHER): Payer: BC Managed Care – PPO | Admitting: Gastroenterology

## 2022-12-19 ENCOUNTER — Other Ambulatory Visit (INDEPENDENT_AMBULATORY_CARE_PROVIDER_SITE_OTHER): Payer: Self-pay | Admitting: Gastroenterology

## 2022-12-19 DIAGNOSIS — R109 Unspecified abdominal pain: Secondary | ICD-10-CM

## 2022-12-19 DIAGNOSIS — K582 Mixed irritable bowel syndrome: Secondary | ICD-10-CM

## 2023-06-28 ENCOUNTER — Other Ambulatory Visit (INDEPENDENT_AMBULATORY_CARE_PROVIDER_SITE_OTHER): Payer: Self-pay | Admitting: Gastroenterology

## 2023-06-28 DIAGNOSIS — R109 Unspecified abdominal pain: Secondary | ICD-10-CM

## 2023-06-28 DIAGNOSIS — K582 Mixed irritable bowel syndrome: Secondary | ICD-10-CM

## 2023-06-28 NOTE — Telephone Encounter (Signed)
 Last seen 04/16/2022. Needs office visit for further refills.

## 2023-07-26 ENCOUNTER — Other Ambulatory Visit (INDEPENDENT_AMBULATORY_CARE_PROVIDER_SITE_OTHER): Payer: Self-pay | Admitting: Gastroenterology

## 2023-07-26 DIAGNOSIS — R109 Unspecified abdominal pain: Secondary | ICD-10-CM

## 2023-07-26 DIAGNOSIS — K582 Mixed irritable bowel syndrome: Secondary | ICD-10-CM

## 2023-07-26 NOTE — Telephone Encounter (Signed)
Needs office visit, last seen 04/16/2022

## 2023-10-25 ENCOUNTER — Other Ambulatory Visit (INDEPENDENT_AMBULATORY_CARE_PROVIDER_SITE_OTHER): Payer: Self-pay | Admitting: Gastroenterology

## 2023-10-25 DIAGNOSIS — R109 Unspecified abdominal pain: Secondary | ICD-10-CM

## 2023-10-25 DIAGNOSIS — K582 Mixed irritable bowel syndrome: Secondary | ICD-10-CM

## 2023-10-25 NOTE — Telephone Encounter (Signed)
 Needs office visit. Last seen 04/16/2022.

## 2024-04-05 ENCOUNTER — Encounter (INDEPENDENT_AMBULATORY_CARE_PROVIDER_SITE_OTHER): Payer: Self-pay | Admitting: Gastroenterology

## 2024-06-19 ENCOUNTER — Ambulatory Visit (INDEPENDENT_AMBULATORY_CARE_PROVIDER_SITE_OTHER): Admitting: Gastroenterology
